# Patient Record
Sex: Male | Born: 1945 | Race: Black or African American | Hispanic: No | Marital: Single | Smoking: Never smoker
Health system: Southern US, Community
[De-identification: ages and names within clinical notes are randomized; demographics above are authoritative.]

## PROBLEM LIST (undated history)

## (undated) DIAGNOSIS — I2510 Atherosclerotic heart disease of native coronary artery without angina pectoris: Secondary | ICD-10-CM

## (undated) DIAGNOSIS — I10 Essential (primary) hypertension: Secondary | ICD-10-CM

## (undated) HISTORY — PX: AXILLARY ARTERY - FEMORAL ARTERY BYPASS GRAFT: SUR177

---

## 2021-09-21 ENCOUNTER — Emergency Department (HOSPITAL_BASED_OUTPATIENT_CLINIC_OR_DEPARTMENT_OTHER)
Admission: EM | Admit: 2021-09-21 | Discharge: 2021-09-21 | Disposition: A | Discharge status: Other Acute Inpatient Hospital | Payer: Medicare Other | Attending: Emergency Medicine | Admitting: Emergency Medicine

## 2021-09-21 ENCOUNTER — Other Ambulatory Visit: Payer: Self-pay

## 2021-09-21 ENCOUNTER — Encounter (HOSPITAL_BASED_OUTPATIENT_CLINIC_OR_DEPARTMENT_OTHER): Payer: Self-pay

## 2021-09-21 ENCOUNTER — Emergency Department (HOSPITAL_BASED_OUTPATIENT_CLINIC_OR_DEPARTMENT_OTHER): Payer: Medicare Other

## 2021-09-21 DIAGNOSIS — I700 Atherosclerosis of aorta: Secondary | ICD-10-CM | POA: Diagnosis not present

## 2021-09-21 DIAGNOSIS — I998 Other disorder of circulatory system: Secondary | ICD-10-CM

## 2021-09-21 DIAGNOSIS — I739 Peripheral vascular disease, unspecified: Secondary | ICD-10-CM | POA: Insufficient documentation

## 2021-09-21 DIAGNOSIS — R200 Anesthesia of skin: Secondary | ICD-10-CM | POA: Diagnosis present

## 2021-09-21 DIAGNOSIS — I743 Embolism and thrombosis of arteries of the lower extremities: Secondary | ICD-10-CM | POA: Insufficient documentation

## 2021-09-21 DIAGNOSIS — I745 Embolism and thrombosis of iliac artery: Secondary | ICD-10-CM | POA: Insufficient documentation

## 2021-09-21 DIAGNOSIS — I70201 Unspecified atherosclerosis of native arteries of extremities, right leg: Secondary | ICD-10-CM | POA: Diagnosis not present

## 2021-09-21 HISTORY — DX: Essential (primary) hypertension: I10

## 2021-09-21 HISTORY — DX: Atherosclerotic heart disease of native coronary artery without angina pectoris: I25.10

## 2021-09-21 LAB — CBC WITH DIFFERENTIAL/PLATELET
Abs Immature Granulocytes: 0.02 10*3/uL (ref 0.00–0.07)
Basophils Absolute: 0.1 10*3/uL (ref 0.0–0.1)
Basophils Relative: 1 %
Eosinophils Absolute: 0.1 10*3/uL (ref 0.0–0.5)
Eosinophils Relative: 2 %
HCT: 39.1 % (ref 39.0–52.0)
Hemoglobin: 13 g/dL (ref 13.0–17.0)
Immature Granulocytes: 0 %
Lymphocytes Relative: 26 %
Lymphs Abs: 1.8 10*3/uL (ref 0.7–4.0)
MCH: 30.4 pg (ref 26.0–34.0)
MCHC: 33.2 g/dL (ref 30.0–36.0)
MCV: 91.6 fL (ref 80.0–100.0)
Monocytes Absolute: 0.8 10*3/uL (ref 0.1–1.0)
Monocytes Relative: 11 %
Neutro Abs: 4.3 10*3/uL (ref 1.7–7.7)
Neutrophils Relative %: 60 %
Platelets: 350 10*3/uL (ref 150–400)
RBC: 4.27 MIL/uL (ref 4.22–5.81)
RDW: 14.6 % (ref 11.5–15.5)
WBC: 7.1 10*3/uL (ref 4.0–10.5)
nRBC: 0 % (ref 0.0–0.2)

## 2021-09-21 LAB — BASIC METABOLIC PANEL
Anion gap: 6 (ref 5–15)
BUN: 20 mg/dL (ref 8–23)
CO2: 27 mmol/L (ref 22–32)
Calcium: 9.3 mg/dL (ref 8.9–10.3)
Chloride: 102 mmol/L (ref 98–111)
Creatinine, Ser: 1.49 mg/dL — ABNORMAL HIGH (ref 0.61–1.24)
GFR, Estimated: 48 mL/min — ABNORMAL LOW (ref >60)
Glucose, Bld: 108 mg/dL — ABNORMAL HIGH (ref 70–99)
Potassium: 3.7 mmol/L (ref 3.5–5.1)
Sodium: 135 mmol/L (ref 135–145)

## 2021-09-21 LAB — APTT: aPTT: 37 seconds — ABNORMAL HIGH (ref 24–36)

## 2021-09-21 LAB — PROTIME-INR
INR: 1.2 (ref 0.8–1.2)
Prothrombin Time: 15.2 seconds (ref 11.4–15.2)

## 2021-09-21 MED ORDER — IOHEXOL 350 MG/ML SOLN
80.0000 mL | Freq: Once | INTRAVENOUS | Status: AC | PRN
  Administered 2021-09-21: 80 mL via INTRAVENOUS

## 2021-09-21 MED ORDER — HEPARIN BOLUS VIA INFUSION
4500.0000 [IU] | Freq: Once | INTRAVENOUS | Status: AC
  Administered 2021-09-21: 4500 [IU] via INTRAVENOUS

## 2021-09-21 MED ORDER — HEPARIN (PORCINE) 25000 UT/250ML-% IV SOLN
1100.0000 [IU]/h | INTRAVENOUS | Status: DC
  Administered 2021-09-21: 1100 [IU]/h via INTRAVENOUS
  Filled 2021-09-21: qty 250

## 2021-09-21 NOTE — ED Notes (Signed)
Leaving with air care at this time. ?

## 2021-09-21 NOTE — ED Triage Notes (Signed)
States right leg has been progressively getting numb over the past few weeks. ?

## 2021-09-21 NOTE — ED Provider Notes (Signed)
?[facility] EMERGENCY DEPARTMENT ?Provider Note ? ? ?CSN: [id number] ?Arrival date & time: 09/21/21  1621 ? ?  ? ?History ? ?Chief Complaint  ?Patient presents with  ? Numbness  ? ? ?Noah Stein is a 76 y.o. male.  Presenting to the ER with concern for leg weakness, numbness.  Reports he has been dealing with intermittent numbness in his right leg for a long time, many months.  States that he has noticed it to be worse though over the last few days and more constant.  Also has noted some weakness in his foot.  Patient is a drummer in a local band and states that he had a hard time using the foot drum due to his right foot weakness last night while he was playing.  Does not have any pain in his leg, does sometimes have pain in his low back. ? ?Completed extensive chart review through care everywhere.  Reviewed last vascular surgery note.  Patient has known history of peripheral arterial disease.  Based on the last office visit, they were not planning intervention at the time because he did not have signs of limb threatening ischemia.  Provider documented toes of right foot slightly cooler than left.  Unable to view ABI report. ? ?HPI ? ?  ? ?Home Medications ?Prior to Admission medications   ?Not on File  ?   ? ?Allergies    ?Patient has no known allergies.   ? ?Review of Systems   ?Review of Systems  ?Constitutional:  Negative for chills and fever.  ?HENT:  Negative for ear pain and sore throat.   ?Eyes:  Negative for pain and visual disturbance.  ?Respiratory:  Negative for cough and shortness of breath.   ?Cardiovascular:  Negative for chest pain and palpitations.  ?Gastrointestinal:  Negative for abdominal pain and vomiting.  ?Genitourinary:  Negative for dysuria and hematuria.  ?Musculoskeletal:  Positive for arthralgias and back pain.  ?Skin:  Negative for color change and rash.  ?Neurological:  Negative for seizures and syncope.  ?All other systems reviewed and are negative. ? ?Physical  Exam ?Updated Vital Signs ?BP 127/73   Pulse 64   Temp 98.4 ?F (36.9 ?C) (Oral)   Resp 18   Ht 6' (1.829 m)   Wt 61.2 kg   SpO2 99%   BMI 18.31 kg/m?  ?Physical Exam ?Vitals and nursing note reviewed.  ?Constitutional:   ?   General: He is not in acute distress. ?   Appearance: He is well-developed.  ?HENT:  ?   Head: Normocephalic and atraumatic.  ?Eyes:  ?   Conjunctiva/sclera: Conjunctivae normal.  ?Cardiovascular:  ?   Rate and Rhythm: Normal rate and regular rhythm.  ?   Heart sounds: No murmur heard. ?Pulmonary:  ?   Effort: Pulmonary effort is normal. No respiratory distress.  ?   Breath sounds: Normal breath sounds.  ?Abdominal:  ?   Palpations: Abdomen is soft.  ?   Tenderness: There is no abdominal tenderness.  ?Musculoskeletal:     ?   General: No swelling.  ?   Cervical back: Neck supple.  ?   Comments: Right lower extremity: Unable to obtain Doppler signal in right foot or right ankle, foot feels slightly cooler than left  ?left lower extremity: No palpable pulse, did appreciate weak Doppler signal in foot  ?Skin: ?   General: Skin is warm and dry.  ?   Capillary Refill: Capillary refill takes less than 2 seconds.  ?Neurological:  ?  Mental Status: He is alert.  ?   Comments: Right lower extremity: Normal hip flexion/extension, there is weakness of both dorsiflexion and plantarflexion; sensation to light touch decreased distal to ankle  ?Psychiatric:     ?   Mood and Affect: Mood normal.  ? ? ?ED Results / Procedures / Treatments   ?Labs ?(all labs ordered are listed, but only abnormal results are displayed) ?Labs Reviewed  ?BASIC METABOLIC PANEL - Abnormal; Notable for the following components:  ?    Result Value  ? Glucose, Bld 108 (*)   ? Creatinine, Ser 1.49 (*)   ? GFR, Estimated 48 (*)   ? All other components within normal limits  ?APTT - Abnormal; Notable for the following components:  ? aPTT 37 (*)   ? All other components within normal limits  ?CBC WITH DIFFERENTIAL/PLATELET   ?PROTIME-INR  ?HEPARIN LEVEL (UNFRACTIONATED)  ? ? ?EKG ?None ? ?Radiology ?CT Angio Aortobifemoral W and/or Wo Contrast ? ?Result Date: 09/21/2021 ?CLINICAL DATA:  Right leg pain, concern for limb ischemia, peripheral arterial disease EXAM: CT ANGIOGRAPHY OF ABDOMINAL AORTA WITH ILIOFEMORAL RUNOFF TECHNIQUE: Multidetector CT imaging of the abdomen, pelvis and lower extremities was performed using the standard protocol during bolus administration of intravenous contrast. Multiplanar CT image reconstructions and MIPs were obtained to evaluate the vascular anatomy. RADIATION DOSE REDUCTION: This exam was performed according to the departmental dose-optimization program which includes automated exposure control, adjustment of the mA and/or kV according to patient size and/or use of iterative reconstruction technique. CONTRAST:  80mL OMNIPAQUE IOHEXOL 350 MG/ML SOLN COMPARISON:  None. FINDINGS: VASCULAR Aorta: Severe mixed calcific atherosclerosis of the abdominal aorta with a large burden of eccentric mural thrombus. 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 Standard branching pattern of the abdominal aorta, with solitary bilateral renal arteries. The inferior mesenteric artery is occluded from the origin. RIGHT Lower Extremity Inflow: Approximately 50% stenosis of the right common iliac artery (series 4, image 94). The right external iliac artery is occluded from near the origin, extending through the right common femoral artery. The right internal iliac artery is severely stenotic, nearly occluded at the origin, however patent distally. Outflow: Long segment occlusion of the right common femoral artery, contiguous with occlusion of the right external iliac artery, with reconstitution of the superficial femoral and profundus femoris at their bifurcation via internal iliac collaterals. There is extensive, multifocal greater than 50% mixed atherosclerotic  stenosis along the length of the right superficial femoral artery and popliteal artery, these vessels very diminutive although opacified. Runoff: The right lower extremity runoff vessels are opacified proximally with scattered calcific stenoses, and the vessels are extinguished above the ankle with no runoff. LEFT Lower Extremity Inflow: High-grade, greater than 70% predominantly calcific stenosis near the origin of the left common femoral artery (series 4, image 93). The left external iliac artery is atherosclerotic although patent. High-grade, near occlusive stenosis of the left internal iliac artery near the origin (series 4, image 101). Outflow: Left common femoral artery is patent. There is long segment occlusion of the left superficial femoral artery from near its origin through Hunter's canal. The profunda remains patent and supplies geniculate collaterals. The left popliteal artery is reconstituted above the knee via geniculate collaterals, and is diminutive although opacified along its length. Runoff: The left anterior tibial and peroneal arteries are extinguished above the ankle with one-vessel runoff to the right ankle via the posterior tibial artery. Veins: No obvious  venous abnormality within the limitations of this arterial phase study. Review of the MIP images confirms the above findings. NON-VASCULAR Lower chest: No acute abnormality.  Coronary artery calcifications. Hepatobiliary: No solid liver abnormality is seen. No gallstones, gallbladder wall thickening, or biliary dilatation. Pancreas: Unremarkable. No pancreatic ductal dilatation or surrounding inflammatory changes. Spleen: Normal in size without significant abnormality. Adrenals/Urinary Tract: Adrenal glands are unremarkable. Kidneys are normal, without renal calculi, solid lesion, or hydronephrosis. Bladder is unremarkable. Stomach/Bowel: Stomach is within normal limits. Appendix is not clearly visualized. No evidence of bowel wall  thickening, distention, or inflammatory changes. Large burden of stool throughout the colon and rectum. Lymphatic: No enlarged abdominal or pelvic lymph nodes. Reproductive: No mass or other significant abnormality. Other: No abdominal wall

## 2021-09-21 NOTE — Progress Notes (Signed)
ANTICOAGULATION CONSULT NOTE - Initial Consult ? ?Pharmacy Consult for Heparin ?Indication:  limb ischemia ? ?No Known Allergies ? ?Patient Measurements: ?Height: 6' (182.9 cm) ?Weight: 61.2 kg (135 lb) ?IBW/kg (Calculated) : 77.6 ?Heparin Dosing Weight: 61.2 kg ? ?Vital Signs: ?Temp: 98.4 ?F (36.9 ?C) (03/26 1632) ?Temp Source: Oral (03/26 1632) ?BP: 170/77 (03/26 1632) ?Pulse Rate: 79 (03/26 1632) ? ?Labs: ?Recent Labs  ?  09/21/21 ?1738  ?HGB 13.0  ?HCT 39.1  ?PLT 350  ?APTT 37*  ?LABPROT 15.2  ?INR 1.2  ?CREATININE 1.49*  ? ? ?Estimated Creatinine Clearance: 35.9 mL/min (A) (by C-G formula based on SCr of 1.49 mg/dL (H)). ? ? ?Medical History: ?Past Medical History:  ?Diagnosis Date  ? Coronary artery disease   ? Hypertension   ? ? ?Medications:  ?(Not in a hospital admission) ? ?Scheduled:  ? heparin  4,500 Units Intravenous Once  ? ?Infusions:  ? heparin    ? ?PRN:  ? ?Assessment: ?45 yom with a history of severe PAD. Patient is presenting with numbness. Heparin per pharmacy consult placed for  limb ischemia . ? ?Patient is not on anticoagulation prior to arrival. ? ?Hgb 13; plt 350 ? ?Goal of Therapy:  ?Heparin level 0.3-0.7 units/ml ?Monitor platelets by anticoagulation protocol: Yes ?  ?Plan:  ?Give 4500 units bolus x 1 ?Start heparin infusion at 1100 units/hr ?Check anti-Xa level in 8 hours and daily while on heparin ?Continue to monitor H&H and platelets ? ?Delmar Landau, PharmD, BCPS ?09/21/2021 6:46 PM ?ED Clinical Pharmacist -  [phone] ? ? ? ?

## 2021-09-21 NOTE — ED Notes (Signed)
Patient left car keys to be given to family member. ?

## 2021-09-23 NOTE — Unmapped External Note (Signed)
 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 The patient was then identified in the pre-anesthesia area, then taken to the operating room, placed in the supine position on the operating table, and induced under general endotracheal anesthesia. The patient was correctly positioned, padded at all pressure points.  The right axilla, abdomen, and bilateral groins were then prepared and draped in a sterile fashion.  A pre-operative timeout was performed.  Preoperative antibiotics were administered at this time.     A right subclavicular incision was made with a #15 blade and dissection was carried down through the skin and subcutaneous tissue.  The pectoralis major muscle was identified and the muscle fibers were bluntly separated to expose the clavipectoral fascia. This was divided with electrocautery and the underlying tissue was dissected to reveal the axillary artery and vein. The axillary artery was dissected circumferentially and controlled with vessel loops. A vertical incision was also made over the right groin and dissection was carried down through the skin and subcutaneous tissue. Bridging veins were sharply divided between ligatures of silk. The inguinal ligament was identified on each side followed by the common femoral artery.  Dissection was carried  distally to the bifurcation.  The common femoral, superficial femoral, and profunda arteries were all circumferentially dissected and isolated with Vesseloops.  A long Geister tunneler was then used to tunnel under the pectoralis minor muscle from the right subclavicular incision down to the right inguinal incision and the 8mm ringed PTFE graft was brought through the tunnel. Correct orientation was maintained and 1:1 movement was ensured.    The patient was then systemically heparinized.  ACTs were maintained greater than 250 throughout the case.  Atraumatic vascular clamps were used to control the axillary artery. a #11 blade was used to create an arteriotomy. This was extended with Pott's scissors. The graft was trimmed and beveled and the anastomosis to the axillary artery was created with 5-0 goretex suture. The axillary artery was backbled and the graft was then deaired. An atraumatic clamp was placed proximally on the graft. Vessel loops were pulled up to control the groin vessels.  A vertical arteriotomy was created with a #11 blade on the CFA and was extended proximally to include the distal EIA and distally with Potts scissors. The arterial lumen was noted to have a mix of chronic soft plaque and acute to subacute appearing thrombus. A penfield elevator was used to perform endarterectomy. The end of the graft was cut to length and spatulated. Anastomosis to the CFA was created with running 6-0 goretex suture. Good hemostasis was ensured at the end of the case.  The right subclavicular and right groin incisions were closed with layers of Vicryl, 4-0 Monocryl, and dermabond.  All incisions were covered with Primapore dressings.  The patient tolerated the procedure well.  The patient was transported to the  PACU in good condition.  Electronically signed by:  Alm Jayson Matters, MD 09/23/2021 6:33 PM     Electronically signed by: Alm Jayson Matters, MD Resident 09/23/21 [phone]  As the attending  surgeon, I was present/scrubbed for the critical and key portions of the procedure including the vessel exposure, tunneling and anastomosis.  A second surgeon, Dr. Jerome was immediately available should a second attending be required. A preoperative discussion was had with the patient and documented in the medical record.    Electronically signed by: Eva Jadine Cara, MD 10/06/21 [phone]

## 2021-09-28 NOTE — Unmapped External Note (Signed)
 Operative Note  Noah Stein   [id number]   Date of Procedure: 09/28/2021  8:45 AM  Pre-op Diagnosis: Post-op Infraclavicular hematoma     Post-op Diagnosis: same  Procedure:  1. Evacuation of hematoma     Surgeon: Surgeon(s) and Role:    * Franky JENEANE Chihuahua, MD - Primary    * Ashlee Almarie Ober, MD - Resident - Assisting  Type of Anesthesia: General  EBL: Minimal, 150 cc of old blood  Drains/Packing:  None  Findings: Focal hematoma of 150 cc in volume with no active bleeding. Some oozing along the vein wall and muscle. Irrigated and hemostatic agents used. Dry after some pressure. Reclosed.   Specimens: No specimens collected   Implants:  Implant Name Type Inv. Item Serial No. Manufacturer Lot No. LRB No. Used Action  AGENT HEMOSTATIC SURGIFLO THROMBIN 8 ML KIT [employer] STERILE - [id number] Other AGENT HEMOSTATIC SURGIFLO THROMBIN 8 ML KIT [employer]RIX STERILE  ETHICON [id number] Right 1 Implanted    Complications:  None       Post-Op Condition: stable               Patient Disposition: PACU - hemodynamically stable.  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 Given size of hematoma and compressive symptoms, will plan for evacuation.             Description of the Procedure:   The patient was taken to the OR and placed in the supine position. Anesthesia was induced. The patient was prepped and draped in the usual sterile fashion. Time out was performed. The incision was opened sharply. The closed muscle sutures were cut and 150 cc of old hematoma was evacuated. This was irrigated with copious amounts of normal saline.  Surgiflow and thrombin spray was used to help with hemostasis. Once dry, hemostatic agents were irrigated. The incision was closed with 3-0 vicryl and 4-0 monocryl. Dressed with dermabond.   All counts were correct at the end of the case. He was  extubated and taken to the recovery room in stable condition.   Electronically signed by: Franky JENEANE Chihuahua, MD Date: 09/28/2021  Time: 9:21 AM   Electronically signed by: Franky JENEANE Chihuahua, MD 09/28/21 [phone]

## 2022-01-18 ENCOUNTER — Encounter (HOSPITAL_BASED_OUTPATIENT_CLINIC_OR_DEPARTMENT_OTHER): Payer: Self-pay | Admitting: Emergency Medicine

## 2022-01-18 ENCOUNTER — Emergency Department (HOSPITAL_BASED_OUTPATIENT_CLINIC_OR_DEPARTMENT_OTHER)
Admission: EM | Admit: 2022-01-18 | Discharge: 2022-01-18 | Disposition: A | Discharge status: Other Acute Inpatient Hospital | Payer: Medicare Other | Attending: Emergency Medicine | Admitting: Emergency Medicine

## 2022-01-18 ENCOUNTER — Other Ambulatory Visit: Payer: Self-pay

## 2022-01-18 DIAGNOSIS — I10 Essential (primary) hypertension: Secondary | ICD-10-CM | POA: Insufficient documentation

## 2022-01-18 DIAGNOSIS — I2510 Atherosclerotic heart disease of native coronary artery without angina pectoris: Secondary | ICD-10-CM | POA: Insufficient documentation

## 2022-01-18 DIAGNOSIS — M79662 Pain in left lower leg: Secondary | ICD-10-CM | POA: Insufficient documentation

## 2022-01-18 DIAGNOSIS — I739 Peripheral vascular disease, unspecified: Secondary | ICD-10-CM

## 2022-01-18 NOTE — ED Triage Notes (Addendum)
Pt reports LLE pain; sts it feels similar to sxs he had with RLE ischemia

## 2022-01-18 NOTE — Discharge Instructions (Addendum)
Please go to [facility] Main ER to be evaluated by Dr. Elonda Husky with vascular surgery.

## 2022-01-18 NOTE — ED Notes (Signed)
Called [facility] line for consult talked to Noah Stein will call provider back with vascular at [phone]

## 2022-01-18 NOTE — ED Notes (Signed)
Attempted IV Rn was unsuccessful

## 2022-01-18 NOTE — ED Provider Notes (Signed)
[facility] EMERGENCY DEPARTMENT Provider Note   CSN: [id number] Arrival date & time: 01/18/22  1156     History  Chief Complaint  Patient presents with   Leg Pain    Noah Stein is a 77 y.o. male.  HPI 76 year old male with history of hypertension, CAD, femoral artery bypass graft on the left leg in March 2023 with Dr. Donnal Moat at [facility] presents to the ER with complaints of intermittent leg numbness, feels as though his left leg has been cold for quite some time.  He is concerned that his left lower extremity is ischemic as this feels similar to what his prior presentation.  He has not followed up with Dr. Donnal Moat.  Review of chart of care everywhere does show a visit in March of this year with Dr. Cherly Hensen for postop evaluation.    Home Medications Prior to Admission medications   Not on File      Allergies    Patient has no known allergies.    Review of Systems   Review of Systems Ten systems reviewed and are negative for acute change, except as noted in the HPI.   Physical Exam Updated Vital Signs BP 106/80 (BP Location: Right Arm)   Pulse 83   Temp 98.1 F (36.7 C) (Oral)   Resp (!) 22   SpO2 98%  Physical Exam Vitals reviewed.  Constitutional:      Appearance: Normal appearance.  HENT:     Head: Normocephalic and atraumatic.  Eyes:     General:        Right eye: No discharge.        Left eye: No discharge.     Extraocular Movements: Extraocular movements intact.     Conjunctiva/sclera: Conjunctivae normal.  Musculoskeletal:        General: No swelling. Normal range of motion.     Comments: Left foot toes are cold, but foot warm.  Able to Doppler posterior tibialis but no DP pulses.  No significant overlying skin changes. 4/5 strenght with plantar flexion and dorsiflexion of the left foot   Neurological:     General: No focal deficit present.     Mental Status: He is alert and oriented to person, place, and time.  Psychiatric:        Mood and Affect:  Mood normal.        Behavior: Behavior normal.     ED Results / Procedures / Treatments   Labs (all labs ordered are listed, but only abnormal results are displayed) Labs Reviewed - No data to display  EKG None  Radiology No results found.  Procedures Procedures    Medications Ordered in ED Medications - No data to display  ED Course/ Medical Decision Making/ A&P                            Medical Decision Making 76 year old male presenting with concerns for left lower extremity ischemia.  On exam, able to doppler tibialis posterior pulse but unable to palpate DP.  Toes are cold.  Unfortunately CT scanner this facility is down at present, unable to evaluate patency of LLE.  Discussed with Dr. Elonda Husky with vascular surgery at [facility], who recommends transfer ER to ER for further imaging and evaluation and possible admission. Pt is amenable to this. Pt will be transported via [facility].  This was a shared visit with my supervising physician Dr. Adela Lank who independently saw and  evaluated the patient & provided guidance in evaluation/management/disposition ,in agreement with care   Final Clinical Impression(s) / ED Diagnoses Final diagnoses:  None    Rx / DC Orders ED Discharge Orders     None         Mare Ferrari, PA-C 01/18/22 1325    Melene Plan, DO 01/18/22 1336

## 2022-01-18 NOTE — ED Notes (Signed)
[facility] arrived at [facility]  . Patient was alert x4. Vital were stable.

## 2022-01-18 NOTE — ED Notes (Signed)
Attempted IV access. Was unable to thread IV. MD aware, and he said we could hold off.

## 2022-01-18 NOTE — ED Notes (Signed)
Patient is able to move left leg. States that is he walks long time on leg it hurts

## 2022-01-18 NOTE — ED Notes (Addendum)
Gave report to Avanell Shackleton @ [phone] RN charge at [facility].Gave report to [facility] ground @ 1307pm spoke to [employer]

## 2022-01-26 NOTE — Progress Notes (Signed)
 ------------------------------------------------------------------------------- Summary: Vascular Surgery Clinic Note -------------------------------------------------------------------------------  Vascular Surgery Clinic Note:  Reason for Visit: Ax -fem f/u PCP: Patient Does Not Have Pcp  Assessment and Plan:  76 y.o. male status post right axillofemoral graft with PTFE by Dr. Delos on 09/23/2021 with subsequent hematoma on 09/28/2021 here for bypass surveillance.    Bypass with patent right-unifem with ABIs on the right of 0.7.  His ABI on the left is 0.22 from 0.31 in May.  Stable claudication in his left leg that has not limited his ability to work. Has chronic bilateral foot numbness likely secondary to his DM2.  No rest pain or tissue loss.   F/u in 6 months with bypass duplex and ABIs.  Discussed initiating cilostazol therapy but patient declined at this point given that claudication is mildly bothersome.   - R ax-fem bypass - f/u in 6 months. Cont ASA, Xarelto , and statin.   Greater 50% of today's clinic visit was spent on counseling.   HPI:  76 y.o. male who comes in today for post-op evaluation, status post right axillofemoral graft with PTFE by Dr. Delos on 09/23/2021 with subsequent hematoma removal by Dr. Tina on 09/28/2021 here for post-op evaluation. Bypass with patent right -unifem with ABIs on the right of 0.7.  His ABI on the left is 0.22 from 0.31 in May.  He now has symptoms of numbness and tingling worse on the left side as compared to the right.  That he reports started since surgery.  He also reports symptoms of claudication to the left thigh that also started after the surgery.  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  This is not lifestyle limiting and he reports that he is able to walk through it and it does not bother him that much.  Denies rest pain, does not have tissue loss. He does not smoke cigarettes, smokes  marijuana occasionaly.    Past Medical History:  Diagnosis Date  . Diabetes mellitus (HCC)   . Hyperlipidemia   . Hypertension    Past Surgical History:  Procedure Laterality Date  . AXILLARY ARTERY - FEMORAL ARTERY BYPASS GRAFT Right 09/23/2021   Procedure: AXILLO FEMORAL BYPASS;  Surgeon: Eva Jadine Delos, MD;  Location: [facility] MAIN OR;  Service: Vascular;  Laterality: Right;  . CAROTID ARTERY ANGIOPLASTY    . EVACUATION HEMATOMA ARM Right 09/28/2021   Procedure: EVACUATION HEMATOMA - RIGHT CHEST WALL;  Surgeon: Franky JENEANE Tina, MD;  Location: [facility] MAIN OR;  Service: Vascular;  Laterality: Right;   Prior to Admission medications   Medication Sig Start Date End Date Taking? Authorizing Provider  acetaminophen (TYLENOL) 325 MG tablet Take 2 tablets (650 mg total) by mouth every 6 (six) hours as needed. 10/02/21  Yes Santana Comes, NP  albuterol  90 mcg/actuation inhaler INHALE 2 PUFFS INTO THE LUNGS EVERY 6 HOURS AS NEEDED FOR WHEEZING 10/24/21  Yes Slater Darice Sole, MD  amLODIPine (NORVASC) 10 MG tablet Take 1 tablet (10 mg total) by mouth daily. 05/05/21  Yes Slater Darice Sole, MD  aspirin 81 MG EC tablet *ANTIPLATELET* Take 1 tablet (81 mg total) by mouth daily. 09/28/13  Yes Historical Provider, MD  losartan-hydrochlorothiazide (HYZAAR) 100-12.5 mg per tablet TAKE 1 TABLET BY MOUTH DAILY 08/11/21  Yes Slater Darice Sole, MD  rivaroxaban  (XARELTO ) 2.5 mg tablet *ANTICOAGULANT* Take 1 tablet (2.5 mg total) by mouth 2 times daily. 10/02/21 10/02/22 Yes Santana Comes, NP  atorvastatin (LIPITOR) 20 MG tablet Take 1 tablet (20 mg total) by mouth nightly. 10/03/21   Slater Darice Sole, MD  potassium chloride  ER (KLOR-CON -M, K-DUR) 20 MEQ extended release tablet TAKE 1 TABLET BY MOUTH DAILY 08/25/21   Slater Darice Sole, MD   .alerg   Review systems:  Complete review of systems negative other than what was mentioned in HPI and PMH.   Physical Exam:  Alert and Oriented BP 172/82 (Site: Left arm)   Pulse 75    Ht 1.829 m (6')   Wt 60.2 kg (132 lb 11.2 oz)   BMI 18.00 kg/m  Neck soft and supple; No carotid bruits Regular, rate, and rhythm; no murmurs, rubs or gallops Clear to auscultation bilaterally Abd soft, Nontender Femoral pulses R 2+, left nonpalpable DP and PT signals on right.  L PT signal R axillary and femoral incisions well healed.  No tissue loss bilateral LE  Imaging:  Images were personally reviewed and interpreted and discussed with patient.  Jonell Juniper, MD  Vascular Surgery Fellow  I saw and evaluated the patient and reviewed the resident's note. I agree with the resident's findings and plan.    Electronically Signed by: Franky JENEANE Chihuahua, MD, Attending Physician 02/03/2022 1:29 PM  Franky Chihuahua, MD Vascular and Endovascular Surgery [phone]    Electronically signed by: Franky JENEANE Chihuahua, MD 02/03/22 1329

## 2022-05-28 ENCOUNTER — Ambulatory Visit (INDEPENDENT_AMBULATORY_CARE_PROVIDER_SITE_OTHER): Payer: Medicare Other | Admitting: Internal Medicine

## 2022-05-28 ENCOUNTER — Encounter: Payer: Self-pay | Admitting: Internal Medicine

## 2022-05-28 VITALS — BP 120/76 | HR 61 | Temp 97.8°F | Resp 18 | Ht 72.0 in | Wt <= 1120 oz

## 2022-05-28 DIAGNOSIS — N1831 Chronic kidney disease, stage 3a: Secondary | ICD-10-CM | POA: Diagnosis not present

## 2022-05-28 DIAGNOSIS — I739 Peripheral vascular disease, unspecified: Secondary | ICD-10-CM | POA: Diagnosis not present

## 2022-05-28 DIAGNOSIS — E785 Hyperlipidemia, unspecified: Secondary | ICD-10-CM | POA: Diagnosis not present

## 2022-05-28 DIAGNOSIS — I10 Essential (primary) hypertension: Secondary | ICD-10-CM

## 2022-05-28 DIAGNOSIS — J3089 Other allergic rhinitis: Secondary | ICD-10-CM

## 2022-05-28 MED ORDER — MONTELUKAST SODIUM 10 MG PO TABS: 10.0000 mg | ORAL_TABLET | Freq: Every day | ORAL | 2 refills | Status: DC

## 2022-05-28 NOTE — Patient Instructions (Signed)
Will do BMP for CKD3 evaluation.

## 2022-05-28 NOTE — Progress Notes (Signed)
   Established Patient Office Visit  Subjective   Patient ID: Noah Stein, male    DOB: 11/18/1945  Age: 76 y.o. MRN: [id number]  Chief Complaint  Patient presents with   Follow-up   Hypertension    76 years old with PAD s/p right leg revascularization early this year, he is able to walk with no pain but left leg hurt when he walk less than a block. He follows with vascular surgery at [facility].   He also has hyperlipidemia and take atorvastatin 20 mg daily.   He has CKD 3a and is here for follow up.   He also has diet control diabetes mellitus and dos not check his sugar at home.   Hypertension This is a chronic problem. The problem is controlled. Pertinent negatives include no chest pain, orthopnea or palpitations. Risk factors for coronary artery disease include male gender and dyslipidemia. Past treatments include ACE inhibitors and calcium channel blockers. Hypertensive end-organ damage includes kidney disease and CAD/MI. Identifiable causes of hypertension include a hypertension causing med.      Review of Systems  Constitutional: Negative.   Cardiovascular: Negative.  Negative for chest pain, palpitations and orthopnea.  Gastrointestinal: Negative.   Neurological: Negative.       Objective:     BP 120/76 (BP Location: Left Arm, Patient Position: Sitting, Cuff Size: Normal)   Pulse 61   Temp 97.8 F (36.6 C)   Resp 18   Ht 6' (1.829 m)   Wt 16 lb (7.258 kg)   SpO2 99%   BMI 2.17 kg/m    Physical Exam Constitutional:      Appearance: Normal appearance.  HENT:     Head: Normocephalic and atraumatic.  Cardiovascular:     Rate and Rhythm: Normal rate and regular rhythm.     Heart sounds: Normal heart sounds.  Pulmonary:     Effort: Pulmonary effort is normal.     Breath sounds: Normal breath sounds.  Abdominal:     General: Bowel sounds are normal.     Palpations: Abdomen is soft.  Neurological:     General: No focal deficit present.     Mental  Status: He is alert and oriented to person, place, and time.      No results found for any visits on 05/28/22.    The ASCVD Risk score (Arnett DK, et al., 2019) failed to calculate for the following reasons:   The valid total cholesterol range is 130 to 320 mg/dL    Assessment & Plan:   Problem List Items Addressed This Visit   None   No follow-ups on file.    Eloisa Northern, MD

## 2022-07-17 LAB — BASIC METABOLIC PANEL
BUN/Creatinine Ratio: 20 (ref 10–24)
BUN: 16 mg/dL (ref 8–27)
CO2: 24 mmol/L (ref 20–29)
Calcium: 9.1 mg/dL (ref 8.6–10.2)
Chloride: 103 mmol/L (ref 96–106)
Creatinine, Ser: 0.82 mg/dL (ref 0.76–1.27)
Glucose: 97 mg/dL (ref 70–99)
Potassium: 5 mmol/L (ref 3.5–5.2)
Sodium: 141 mmol/L (ref 134–144)
eGFR: 90 mL/min/{1.73_m2} (ref >59)

## 2022-07-17 LAB — SPECIMEN STATUS REPORT

## 2022-07-21 NOTE — Progress Notes (Signed)
Patient called.  Unable to reach patient.Will mail letter for normal labs.

## 2022-08-24 ENCOUNTER — Other Ambulatory Visit: Payer: Self-pay | Admitting: Internal Medicine

## 2022-08-27 ENCOUNTER — Ambulatory Visit: Payer: BC Managed Care – PPO | Admitting: Internal Medicine

## 2022-08-27 ENCOUNTER — Encounter: Payer: Self-pay | Admitting: Internal Medicine

## 2022-08-27 VITALS — BP 130/80 | Temp 97.3°F | Resp 18 | Ht 72.0 in | Wt 138.5 lb

## 2022-08-27 DIAGNOSIS — I10 Essential (primary) hypertension: Secondary | ICD-10-CM | POA: Diagnosis not present

## 2022-08-27 DIAGNOSIS — E785 Hyperlipidemia, unspecified: Secondary | ICD-10-CM

## 2022-08-27 DIAGNOSIS — R7303 Prediabetes: Secondary | ICD-10-CM

## 2022-08-27 DIAGNOSIS — I739 Peripheral vascular disease, unspecified: Secondary | ICD-10-CM | POA: Diagnosis not present

## 2022-08-27 DIAGNOSIS — R062 Wheezing: Secondary | ICD-10-CM | POA: Diagnosis not present

## 2022-08-27 MED ORDER — ALBUTEROL SULFATE HFA 108 (90 BASE) MCG/ACT IN AERS: 2.0000 | INHALATION_SPRAY | Freq: Four times a day (QID) | RESPIRATORY_TRACT | 3 refills | Status: AC | PRN

## 2022-08-27 MED ORDER — MONTELUKAST SODIUM 10 MG PO TABS: 10.0000 mg | ORAL_TABLET | Freq: Every day | ORAL | 2 refills | Status: DC

## 2022-08-27 NOTE — Assessment & Plan Note (Signed)
stable °

## 2022-08-27 NOTE — Assessment & Plan Note (Signed)
He will continue to follow with vascular surgeon.

## 2022-08-27 NOTE — Progress Notes (Addendum)
   Office Visit  Subjective   Patient ID: Noah Stein   DOB: [birth year]   Age: 77 y.o.   MRN: [id number]   Chief Complaint Chief Complaint  Patient presents with   Follow-up    3 month follow up Primary Hypertension     History of Present Illness 77 years old with follow up. He says he is wheezing at night, he use to take inhalor but stop taking it. He is not use if he has COPD or not. He denies exertional dyspnea.   He has PAD s/p right leg revascularization in 2023. He does not have pain but left leg hurt when he walk less than a block. He follows with vascular surgery at [facility].    He also has hyperlipidemia and take atorvastatin 20 mg daily.    He has CKD 3a but labs done last month shows GFR > 59.    He also has diet control diabetes mellitus and dos not check his sugar at home.    Hypertension This is a chronic problem. The problem is controlled. Pertinent negatives include no chest pain, orthopnea or palpitations. Risk factors for coronary artery disease include male gender and dyslipidemia. Past treatments include ACE inhibitors and calcium channel blockers. Hypertensive end-organ damage includes kidney disease and CAD/MI. Identifiable causes of hypertension include a hypertension causing med.   Past Medical History Past Medical History:  Diagnosis Date   Coronary artery disease    Hypertension      Allergies No Known Allergies   Review of Systems Review of Systems  Constitutional: Negative.   HENT: Negative.    Respiratory:  Positive for wheezing.   Cardiovascular: Negative.   Gastrointestinal: Negative.   Neurological: Negative.        Objective:    Vitals BP 130/80 (BP Location: Left Arm, Patient Position: Sitting, Cuff Size: Normal)   Temp (!) 97.3 F (36.3 C)   Resp 18   Ht 6' (1.829 m)   Wt 138 lb 8 oz (62.8 kg)   BMI 18.78 kg/m    Physical Examination Physical Exam Constitutional:      Appearance: Normal appearance. He is normal  weight.  HENT:     Head: Normocephalic and atraumatic.  Eyes:     Extraocular Movements: Extraocular movements intact.     Pupils: Pupils are equal, round, and reactive to light.  Cardiovascular:     Rate and Rhythm: Normal rate and regular rhythm.     Heart sounds: Normal heart sounds.  Pulmonary:     Effort: Pulmonary effort is normal.     Breath sounds: Normal breath sounds.  Abdominal:     General: Bowel sounds are normal.     Palpations: Abdomen is soft.  Neurological:     General: No focal deficit present.     Mental Status: He is alert and oriented to person, place, and time.        Assessment & Plan:   Wheezing I will give him albuterol inhalor and will do spirometery.  Dyslipidemia Will do lipid panel today, continue with atorvastatin 20 mg daily.  Primary hypertension stable  PAD (peripheral artery disease) ([place]) He will continue to follow with vascular surgeon.  Borderline diabetes mellitus I will do HBA1c today.    Return in about 2 months (around 10/26/2022).   Garwin Brothers, MD

## 2022-08-27 NOTE — Assessment & Plan Note (Signed)
I will give him albuterol inhalor and will do spirometery.

## 2022-08-27 NOTE — Assessment & Plan Note (Signed)
I will do HBA1c today.

## 2022-08-27 NOTE — Assessment & Plan Note (Signed)
Will do lipid panel today, continue with atorvastatin 20 mg daily.

## 2022-09-01 ENCOUNTER — Ambulatory Visit: Payer: BC Managed Care – PPO

## 2022-10-12 ENCOUNTER — Other Ambulatory Visit: Payer: Self-pay

## 2022-10-12 MED ORDER — XARELTO 2.5 MG PO TABS: 2.5000 mg | ORAL_TABLET | Freq: Two times a day (BID) | ORAL | 1 refills | Status: DC

## 2022-10-29 ENCOUNTER — Ambulatory Visit: Payer: BC Managed Care – PPO | Admitting: Internal Medicine

## 2022-12-10 ENCOUNTER — Other Ambulatory Visit: Payer: Self-pay | Admitting: Internal Medicine

## 2023-02-16 NOTE — Progress Notes (Signed)
 Vascular Surgery Clinic Note:  Reason for Visit: Ax -fem f/u PCP: Patient Does Not Have Pcp  Assessment and Plan:  77 y.o. male with hx of HTN, HLD, CAD, Dm2, and R CEA/L ICA occlusion status post right axillofemoral graft with PTFE on 09/23/2021 with subsequent hematoma washout here for bypass surveillance.    Bypass with patent right-unifem with ABIs on the right of 0.92.  His ABI on the left is 0.49 in May.  Both improved values. Improved claudication left leg that is now minimally noticeable. No rest pain or tissue loss. Will need to continue bypass surveillance. Repeat studies in 1 or so for surveillance.   Can coordinate studies given his ongoing carotid surveillance as well. Can consolidate to 12 months after his next visit for his carotid for his bypass/carotid surveillance.   - R ax-fem bypass - Cont ASA, low dose Xarelto , and statin.  - Carotid disease - cont f/u as sch in 4 months for ongoing surveillance.    Greater 50% of today's clinic visit was spent on counseling.   HPI:  77 y.o. male with hx of HTN, HLD, CAD, Dm2, and R CEA/L ICA occlusion status post right axillofemoral graft with PTFE on 09/23/2021 with subsequent hematoma washout here for bypass surveillance.   Doing well overall with minimal if any claudication symptoms. Remains active and ambulatory at home. No rest pain or wounds. Former smoker. Some marijuana use. No notable numbness in his lower extremities.    Past Medical History:  Diagnosis Date  . Diabetes mellitus (HCC)   . Hyperlipidemia   . Hypertension    Past Surgical History:  Procedure Laterality Date  . AXILLARY ARTERY - FEMORAL ARTERY BYPASS GRAFT Right 09/23/2021   Procedure: AXILLO FEMORAL BYPASS;  Surgeon: Eva Jadine Cara, MD;  Location: [facility] MAIN OR;  Service: Vascular;  Laterality: Right;  . CAROTID ARTERY ANGIOPLASTY    . EVACUATION HEMATOMA ARM Right 09/28/2021   Procedure: EVACUATION HEMATOMA - RIGHT CHEST WALL;  Surgeon: Franky JENEANE Chihuahua,  MD;  Location: [facility] MAIN OR;  Service: Vascular;  Laterality: Right;   Prior to Admission medications   Medication Sig Start Date End Date Taking? Authorizing Provider  acetaminophen (TYLENOL) 325 MG tablet Take 2 tablets (650 mg total) by mouth every 6 (six) hours as needed. 10/02/21  Yes Santana Comes, NP  albuterol  90 mcg/actuation inhaler INHALE 2 PUFFS INTO THE LUNGS EVERY 6 HOURS AS NEEDED FOR WHEEZING 10/24/21  Yes Slater Darice Sole, MD  amLODIPine (NORVASC) 10 MG tablet Take 1 tablet (10 mg total) by mouth daily. 05/05/21  Yes Slater Darice Sole, MD  aspirin 81 MG EC tablet *ANTIPLATELET* Take 1 tablet (81 mg total) by mouth daily. 09/28/13  Yes Historical Provider, MD  losartan-hydrochlorothiazide (HYZAAR) 100-12.5 mg per tablet TAKE 1 TABLET BY MOUTH DAILY 08/11/21  Yes Slater Darice Sole, MD  rivaroxaban  (XARELTO ) 2.5 mg tablet *ANTICOAGULANT* Take 1 tablet (2.5 mg total) by mouth 2 times daily. 10/02/21 10/02/22 Yes Santana Comes, NP  atorvastatin (LIPITOR) 20 MG tablet Take 1 tablet (20 mg total) by mouth nightly. 10/03/21   Slater Darice Sole, MD  potassium chloride  ER (KLOR-CON -M, K-DUR) 20 MEQ extended release tablet TAKE 1 TABLET BY MOUTH DAILY 08/25/21   Slater Darice Sole, MD   .alerg   Review systems:  Complete review of systems negative other than what was mentioned in HPI and PMH.   Physical Exam:  Alert and Oriented BP (!) 208/81 (BP Location: Left arm, Patient Position: Sitting)  Pulse 56   Ht 1.829 m (6')   Wt 62.3 kg (137 lb 6.4 oz)   SpO2 96%   BMI 18.63 kg/m  Neck soft and supple; No carotid bruits Regular, rate, and rhythm; no murmurs, rubs or gallops Clear to auscultation bilaterally Abd soft, Nontender Femoral pulses R 2+, left nonpalpable DP and PT signals bilaterally.  No wounds or edema.   Imaging:  Images were personally reviewed and interpreted and discussed with patient.  Franky Chihuahua, MD Vascular and Endovascular Surgery [phone]

## 2023-02-19 ENCOUNTER — Other Ambulatory Visit: Payer: Self-pay | Admitting: Internal Medicine

## 2023-04-13 ENCOUNTER — Emergency Department (HOSPITAL_BASED_OUTPATIENT_CLINIC_OR_DEPARTMENT_OTHER): Payer: BC Managed Care – PPO

## 2023-04-13 ENCOUNTER — Emergency Department (HOSPITAL_BASED_OUTPATIENT_CLINIC_OR_DEPARTMENT_OTHER)
Admission: EM | Admit: 2023-04-13 | Discharge: 2023-04-13 | Disposition: A | Discharge status: Home / Self Care | Payer: BC Managed Care – PPO | Attending: Emergency Medicine | Admitting: Emergency Medicine

## 2023-04-13 ENCOUNTER — Encounter (HOSPITAL_BASED_OUTPATIENT_CLINIC_OR_DEPARTMENT_OTHER): Payer: Self-pay | Admitting: Emergency Medicine

## 2023-04-13 ENCOUNTER — Other Ambulatory Visit: Payer: Self-pay

## 2023-04-13 DIAGNOSIS — Z1152 Encounter for screening for COVID-19: Secondary | ICD-10-CM | POA: Insufficient documentation

## 2023-04-13 DIAGNOSIS — I2510 Atherosclerotic heart disease of native coronary artery without angina pectoris: Secondary | ICD-10-CM | POA: Diagnosis not present

## 2023-04-13 DIAGNOSIS — Z79899 Other long term (current) drug therapy: Secondary | ICD-10-CM | POA: Insufficient documentation

## 2023-04-13 DIAGNOSIS — F1290 Cannabis use, unspecified, uncomplicated: Secondary | ICD-10-CM | POA: Diagnosis not present

## 2023-04-13 DIAGNOSIS — J40 Bronchitis, not specified as acute or chronic: Secondary | ICD-10-CM | POA: Insufficient documentation

## 2023-04-13 DIAGNOSIS — I10 Essential (primary) hypertension: Secondary | ICD-10-CM | POA: Insufficient documentation

## 2023-04-13 DIAGNOSIS — R0602 Shortness of breath: Secondary | ICD-10-CM | POA: Diagnosis present

## 2023-04-13 LAB — CBC WITH DIFFERENTIAL/PLATELET
Abs Immature Granulocytes: 0 10*3/uL (ref 0.00–0.07)
Basophils Absolute: 0.1 10*3/uL (ref 0.0–0.1)
Basophils Relative: 2 %
Eosinophils Absolute: 0.2 10*3/uL (ref 0.0–0.5)
Eosinophils Relative: 4 %
HCT: 42 % (ref 39.0–52.0)
Hemoglobin: 13.8 g/dL (ref 13.0–17.0)
Immature Granulocytes: 0 %
Lymphocytes Relative: 32 %
Lymphs Abs: 1.5 10*3/uL (ref 0.7–4.0)
MCH: 28.9 pg (ref 26.0–34.0)
MCHC: 32.9 g/dL (ref 30.0–36.0)
MCV: 87.9 fL (ref 80.0–100.0)
Monocytes Absolute: 0.5 10*3/uL (ref 0.1–1.0)
Monocytes Relative: 12 %
Neutro Abs: 2.3 10*3/uL (ref 1.7–7.7)
Neutrophils Relative %: 50 %
Platelets: 266 10*3/uL (ref 150–400)
RBC: 4.78 MIL/uL (ref 4.22–5.81)
RDW: 14.3 % (ref 11.5–15.5)
WBC: 4.6 10*3/uL (ref 4.0–10.5)
nRBC: 0 % (ref 0.0–0.2)

## 2023-04-13 LAB — RESP PANEL BY RT-PCR (RSV, FLU A&B, COVID)  RVPGX2
Influenza A by PCR: NEGATIVE
Influenza B by PCR: NEGATIVE
Resp Syncytial Virus by PCR: NEGATIVE
SARS Coronavirus 2 by RT PCR: NEGATIVE

## 2023-04-13 LAB — BASIC METABOLIC PANEL
Anion gap: 13 (ref 5–15)
BUN: 17 mg/dL (ref 8–23)
CO2: 27 mmol/L (ref 22–32)
Calcium: 9.1 mg/dL (ref 8.9–10.3)
Chloride: 98 mmol/L (ref 98–111)
Creatinine, Ser: 1.52 mg/dL — ABNORMAL HIGH (ref 0.61–1.24)
GFR, Estimated: 46 mL/min — ABNORMAL LOW (ref >60)
Glucose, Bld: 106 mg/dL — ABNORMAL HIGH (ref 70–99)
Potassium: 3.6 mmol/L (ref 3.5–5.1)
Sodium: 138 mmol/L (ref 135–145)

## 2023-04-13 LAB — BRAIN NATRIURETIC PEPTIDE: B Natriuretic Peptide: 93.1 pg/mL (ref 0.0–100.0)

## 2023-04-13 LAB — TROPONIN I (HIGH SENSITIVITY): Troponin I (High Sensitivity): 8 ng/L (ref <18)

## 2023-04-13 MED ORDER — IPRATROPIUM-ALBUTEROL 0.5-2.5 (3) MG/3ML IN SOLN
3.0000 mL | Freq: Once | RESPIRATORY_TRACT | Status: AC
  Administered 2023-04-13: 3 mL via RESPIRATORY_TRACT
  Filled 2023-04-13: qty 3

## 2023-04-13 MED ORDER — PREDNISONE 20 MG PO TABS: 40.0000 mg | ORAL_TABLET | Freq: Every day | ORAL | 0 refills | Status: AC

## 2023-04-13 MED ORDER — ALBUTEROL SULFATE HFA 108 (90 BASE) MCG/ACT IN AERS
2.0000 | INHALATION_SPRAY | Freq: Once | RESPIRATORY_TRACT | Status: AC
  Administered 2023-04-13: 2 via RESPIRATORY_TRACT
  Filled 2023-04-13: qty 6.7

## 2023-04-13 MED ORDER — PREDNISONE 50 MG PO TABS
60.0000 mg | ORAL_TABLET | Freq: Once | ORAL | Status: AC
  Administered 2023-04-13: 60 mg via ORAL
  Filled 2023-04-13: qty 1

## 2023-04-13 MED ORDER — AZITHROMYCIN 250 MG PO TABS: 250.0000 mg | ORAL_TABLET | Freq: Every day | ORAL | 0 refills | Status: AC

## 2023-04-13 MED ORDER — ALBUTEROL SULFATE HFA 108 (90 BASE) MCG/ACT IN AERS: 2.0000 | INHALATION_SPRAY | RESPIRATORY_TRACT | Status: DC | PRN

## 2023-04-13 NOTE — ED Notes (Signed)
Fall risk armband Fall risk sign on door Patient wearing shoes

## 2023-04-13 NOTE — ED Provider Notes (Signed)
[facility] Provider Note   CSN: [id number] Arrival date & time: 04/13/23  0801     History  Chief Complaint  Patient presents with   Shortness of Breath    Noah Stein is a 77 y.o. male.  Patient here with shortness of breath.  Symptoms for the last few months but worse over the last few days.  Has been coughing a lot with some mucus.  Denies any cigarette smoking history.  Does smoke marijuana.  Denies any chest pain weakness numbness tingling.  History of peripheral arterial disease status post bypass graft.  History of CAD and hypertension.  Denies any fevers or chills.  Symptoms are mostly worse with exertion at times.  Denies any leg swelling.  Denies any recent surgery or travel.  The history is provided by the patient.       Home Medications Prior to Admission medications   Medication Sig Start Date End Date Taking? Authorizing Provider  azithromycin (ZITHROMAX) 250 MG tablet Take 1 tablet (250 mg total) by mouth daily. Take first 2 tablets together, then 1 every day until finished. 04/13/23  Yes Noah Fregeau, DO  predniSONE (DELTASONE) 20 MG tablet Take 2 tablets (40 mg total) by mouth daily for 5 days. 04/13/23 04/18/23 Yes Noah Templin, DO  acetaminophen (TYLENOL) 325 MG tablet Take by mouth. 10/02/21   [provider]  albuterol (VENTOLIN HFA) 108 (90 Base) MCG/ACT inhaler Inhale 2 puffs into the lungs every 6 (six) hours as needed for wheezing or shortness of breath. 08/27/22   Noah Northern, MD  amLODipine (NORVASC) 10 MG tablet Take 1 tablet by mouth daily. 09/24/14   [provider]  ASPIRIN LOW DOSE 81 MG tablet Take 81 mg by mouth daily.    [provider]  atorvastatin (LIPITOR) 20 MG tablet Take 20 mg by mouth daily.    [provider]  losartan-hydrochlorothiazide (HYZAAR) 100-12.5 MG tablet Take 1 tablet by mouth daily. 07/14/22   [provider]  montelukast (SINGULAIR)  10 MG tablet TAKE 1 TABLET(10 MG) BY MOUTH AT BEDTIME 02/19/23   Noah Northern, MD  potassium chloride SA (KLOR-CON M) 20 MEQ tablet TAKE 1 TABLET(20 MEQ) BY MOUTH DAILY 02/17/17   [provider]  XARELTO 2.5 MG TABS tablet TAKE 1 TABLET(2.5 MG) BY MOUTH TWICE DAILY 12/10/22   Noah Northern, MD      Allergies    Patient has no known allergies.    Review of Systems   Review of Systems  Physical Exam Updated Vital Signs BP (!) 186/93   Pulse 62   Temp 97.7 F (36.5 C)   Resp 17   Wt 63.5 kg   SpO2 97%   BMI 18.99 kg/m  Physical Exam Vitals and nursing note reviewed.  Constitutional:      General: He is not in acute distress.    Appearance: He is well-developed.  HENT:     Head: Normocephalic and atraumatic.     Mouth/Throat:     Mouth: Mucous membranes are moist.  Eyes:     Extraocular Movements: Extraocular movements intact.     Conjunctiva/sclera: Conjunctivae normal.     Pupils: Pupils are equal, round, and reactive to light.  Cardiovascular:     Rate and Rhythm: Normal rate and regular rhythm.     Pulses: Normal pulses.     Heart sounds: Normal heart sounds. No murmur heard. Pulmonary:     Effort: Pulmonary effort is normal.  No respiratory distress.     Breath sounds: Wheezing present.  Abdominal:     Palpations: Abdomen is soft.     Tenderness: There is no abdominal tenderness.  Musculoskeletal:        General: No swelling.     Cervical back: Normal range of motion and neck supple.     Right lower leg: No edema.     Left lower leg: No edema.  Skin:    General: Skin is warm and dry.     Capillary Refill: Capillary refill takes less than 2 seconds.  Neurological:     Mental Status: He is alert.  Psychiatric:        Mood and Affect: Mood normal.     ED Results / Procedures / Treatments   Labs (all labs ordered are listed, but only abnormal results are displayed) Labs Reviewed  BASIC METABOLIC PANEL - Abnormal; Notable for the following components:       Result Value   Glucose, Bld 106 (*)    Creatinine, Ser 1.52 (*)    GFR, Estimated 46 (*)    All other components within normal limits  RESP PANEL BY RT-PCR (RSV, FLU A&B, COVID)  RVPGX2  CBC WITH DIFFERENTIAL/PLATELET  BRAIN NATRIURETIC PEPTIDE  TROPONIN I (HIGH SENSITIVITY)    EKG EKG Interpretation Date/Time:  Tuesday April 13 2023 08:23:25 EDT Ventricular Rate:  69 PR Interval:  201 QRS Duration:  117 QT Interval:  474 QTC Calculation: 508 R Axis:   76  Text Interpretation: Sinus rhythm Nonspecific intraventricular conduction delay Confirmed by Noah Stein [phone]) on 04/13/2023 9:22:01 AM  Radiology DG Chest Portable 1 View  Result Date: 04/13/2023 CLINICAL DATA:  Shortness of breath. EXAM: PORTABLE CHEST 1 VIEW COMPARISON:  None Available. FINDINGS: The heart size and mediastinal contours are within normal limits. Aortic calcification. Both lungs are clear. No pneumothorax or significant pleural effusion. No acute osseous abnormality. IMPRESSION: No acute cardiopulmonary findings. Electronically Signed   By: Noah Stein M.D.   On: 04/13/2023 09:36    Procedures Procedures    Medications Ordered in ED Medications  albuterol (VENTOLIN HFA) 108 (90 Base) MCG/ACT inhaler 2 puff (has no administration in time range)  predniSONE (DELTASONE) tablet 60 mg (has no administration in time range)  ipratropium-albuterol (DUONEB) 0.5-2.5 (3) MG/3ML nebulizer solution 3 mL (3 mLs Nebulization Given 04/13/23 6045)    ED Course/ Medical Decision Making/ A&P                                 Medical Decision Making Amount and/or Complexity of Data Reviewed Labs: ordered. Radiology: ordered.  Risk Prescription drug management.   Noah Stein is here with shortness of breath.  Seems somewhat chronic but worse the last few days.  Does have a little bit of wheezing on exam.  Vital signs unremarkable.  Differential diagnosis could be reactive airway process, bronchitis but  will evaluate for ACS, pneumonia, electrolyte abnormality, COVID and flu.  Will get CBC BMP troponin, BNP chest x-ray.  EKG shows sinus rhythm.  No ischemic changes.  I reviewed interpreted EKG.  Labs overall unremarkable per my review interpretation.  No significant anemia or electrolyte abnormality.  Creatinine 1.5.  This seems to be around baseline at times.  He is not dehydrated.  COVID and flu test negative.  Chest x-ray negative for pneumonia per my review interpretation.  Troponin and BNP unremarkable.  I have  no concern for ACS or PE or heart failure.  Overall we will treat for what I suspect is bronchitis with steroids and antibiotics and have her follow-up with primary care doctor.  Discharged in good condition.  Understands return precautions.  This chart was dictated using voice recognition software.  Despite best efforts to proofread,  errors can occur which can change the documentation meaning.         Final Clinical Impression(s) / ED Diagnoses Final diagnoses:  Shortness of breath  Bronchitis    Rx / DC Orders ED Discharge Orders          Ordered    predniSONE (DELTASONE) 20 MG tablet  Daily        04/13/23 1109    azithromycin (ZITHROMAX) 250 MG tablet  Daily        04/13/23 1110              Javante Nilsson, Madelaine Bhat, DO 04/13/23 1110

## 2023-04-13 NOTE — ED Triage Notes (Signed)
Reports shortness of breath with exertion x months .  Denies chest pain . Coughing clear mucus for months , he reports.

## 2023-04-13 NOTE — Discharge Instructions (Addendum)
Take next dose of steroid tomorrow.  Take antibiotic when you get home.  Follow-up with your primary care doctor.  Return if symptoms worsen.

## 2023-05-19 ENCOUNTER — Other Ambulatory Visit: Payer: Self-pay | Admitting: Internal Medicine

## 2023-07-14 ENCOUNTER — Other Ambulatory Visit: Payer: Self-pay | Admitting: Internal Medicine

## 2023-08-18 ENCOUNTER — Other Ambulatory Visit: Payer: Self-pay | Admitting: Internal Medicine

## 2023-09-09 ENCOUNTER — Encounter (HOSPITAL_BASED_OUTPATIENT_CLINIC_OR_DEPARTMENT_OTHER): Payer: Self-pay | Admitting: Emergency Medicine

## 2023-09-09 ENCOUNTER — Emergency Department (HOSPITAL_BASED_OUTPATIENT_CLINIC_OR_DEPARTMENT_OTHER)
Admission: EM | Admit: 2023-09-09 | Discharge: 2023-09-09 | Disposition: A | Discharge status: Home / Self Care | Attending: Emergency Medicine | Admitting: Emergency Medicine

## 2023-09-09 ENCOUNTER — Other Ambulatory Visit: Payer: Self-pay

## 2023-09-09 ENCOUNTER — Emergency Department (HOSPITAL_BASED_OUTPATIENT_CLINIC_OR_DEPARTMENT_OTHER)

## 2023-09-09 DIAGNOSIS — I10 Essential (primary) hypertension: Secondary | ICD-10-CM | POA: Insufficient documentation

## 2023-09-09 DIAGNOSIS — E119 Type 2 diabetes mellitus without complications: Secondary | ICD-10-CM | POA: Diagnosis not present

## 2023-09-09 DIAGNOSIS — Z79899 Other long term (current) drug therapy: Secondary | ICD-10-CM | POA: Insufficient documentation

## 2023-09-09 DIAGNOSIS — Z7982 Long term (current) use of aspirin: Secondary | ICD-10-CM | POA: Insufficient documentation

## 2023-09-09 DIAGNOSIS — Z7901 Long term (current) use of anticoagulants: Secondary | ICD-10-CM | POA: Insufficient documentation

## 2023-09-09 DIAGNOSIS — I2510 Atherosclerotic heart disease of native coronary artery without angina pectoris: Secondary | ICD-10-CM | POA: Insufficient documentation

## 2023-09-09 DIAGNOSIS — I70212 Atherosclerosis of native arteries of extremities with intermittent claudication, left leg: Secondary | ICD-10-CM | POA: Insufficient documentation

## 2023-09-09 DIAGNOSIS — R42 Dizziness and giddiness: Secondary | ICD-10-CM | POA: Insufficient documentation

## 2023-09-09 DIAGNOSIS — I739 Peripheral vascular disease, unspecified: Secondary | ICD-10-CM

## 2023-09-09 DIAGNOSIS — M79605 Pain in left leg: Secondary | ICD-10-CM

## 2023-09-09 LAB — CBC WITH DIFFERENTIAL/PLATELET
Abs Immature Granulocytes: 0.02 10*3/uL (ref 0.00–0.07)
Basophils Absolute: 0.1 10*3/uL (ref 0.0–0.1)
Basophils Relative: 1 %
Eosinophils Absolute: 0 10*3/uL (ref 0.0–0.5)
Eosinophils Relative: 0 %
HCT: 40.9 % (ref 39.0–52.0)
Hemoglobin: 13.7 g/dL (ref 13.0–17.0)
Immature Granulocytes: 0 %
Lymphocytes Relative: 14 %
Lymphs Abs: 1.1 10*3/uL (ref 0.7–4.0)
MCH: 29.8 pg (ref 26.0–34.0)
MCHC: 33.5 g/dL (ref 30.0–36.0)
MCV: 89.1 fL (ref 80.0–100.0)
Monocytes Absolute: 0.6 10*3/uL (ref 0.1–1.0)
Monocytes Relative: 7 %
Neutro Abs: 6.1 10*3/uL (ref 1.7–7.7)
Neutrophils Relative %: 78 %
Platelets: 205 10*3/uL (ref 150–400)
RBC: 4.59 MIL/uL (ref 4.22–5.81)
RDW: 14.6 % (ref 11.5–15.5)
WBC: 7.8 10*3/uL (ref 4.0–10.5)
nRBC: 0 % (ref 0.0–0.2)

## 2023-09-09 LAB — BASIC METABOLIC PANEL
Anion gap: 9 (ref 5–15)
BUN: 26 mg/dL — ABNORMAL HIGH (ref 8–23)
CO2: 27 mmol/L (ref 22–32)
Calcium: 9.3 mg/dL (ref 8.9–10.3)
Chloride: 101 mmol/L (ref 98–111)
Creatinine, Ser: 1.65 mg/dL — ABNORMAL HIGH (ref 0.61–1.24)
GFR, Estimated: 42 mL/min — ABNORMAL LOW (ref >60)
Glucose, Bld: 102 mg/dL — ABNORMAL HIGH (ref 70–99)
Potassium: 4.3 mmol/L (ref 3.5–5.1)
Sodium: 137 mmol/L (ref 135–145)

## 2023-09-09 MED ORDER — IOHEXOL 350 MG/ML SOLN
100.0000 mL | Freq: Once | INTRAVENOUS | Status: AC | PRN
  Administered 2023-09-09: 100 mL via INTRAVENOUS

## 2023-09-09 NOTE — ED Provider Notes (Addendum)
 Patient had some left leg pain last night and had some this morning and then had some bilateral leg numbness which led to doing a CT angio.  Results for orders placed or performed during the hospital encounter of 09/09/23  Basic metabolic panel   Collection Time: 09/09/23 10:33 AM  Result Value Ref Range   Sodium 137 135 - 145 mmol/L   Potassium 4.3 3.5 - 5.1 mmol/L   Chloride 101 98 - 111 mmol/L   CO2 27 22 - 32 mmol/L   Glucose, Bld 102 (H) 70 - 99 mg/dL   BUN 26 (H) 8 - 23 mg/dL   Creatinine, Ser 6.57 (H) 0.61 - 1.24 mg/dL   Calcium 9.3 8.9 - 84.6 mg/dL   GFR, Estimated 42 (L) >60 mL/min   Anion gap 9 5 - 15  CBC with Differential   Collection Time: 09/09/23 10:33 AM  Result Value Ref Range   WBC 7.8 4.0 - 10.5 K/uL   RBC 4.59 4.22 - 5.81 MIL/uL   Hemoglobin 13.7 13.0 - 17.0 g/dL   HCT 96.2 95.2 - 84.1 %   MCV 89.1 80.0 - 100.0 fL   MCH 29.8 26.0 - 34.0 pg   MCHC 33.5 30.0 - 36.0 g/dL   RDW 32.4 40.1 - 02.7 %   Platelets 205 150 - 400 K/uL   nRBC 0.0 0.0 - 0.2 %   Neutrophils Relative % 78 %   Neutro Abs 6.1 1.7 - 7.7 K/uL   Lymphocytes Relative 14 %   Lymphs Abs 1.1 0.7 - 4.0 K/uL   Monocytes Relative 7 %   Monocytes Absolute 0.6 0.1 - 1.0 K/uL   Eosinophils Relative 0 %   Eosinophils Absolute 0.0 0.0 - 0.5 K/uL   Basophils Relative 1 %   Basophils Absolute 0.1 0.0 - 0.1 K/uL   Immature Granulocytes 0 %   Abs Immature Granulocytes 0.02 0.00 - 0.07 K/uL   CT ANGIO LOWER EXT BILAT W &/OR WO CONTRAST Result Date: 09/09/2023 CLINICAL DATA:  Claudication or leg ischemia EXAM: CT ANGIOGRAPHY OF ABDOMINAL AORTA WITH ILIOFEMORAL RUNOFF TECHNIQUE: Multidetector CT imaging of the abdomen, pelvis and lower extremities was performed using the standard protocol during bolus administration of intravenous contrast. Multiplanar CT image reconstructions and MIPs were obtained to evaluate the vascular anatomy. RADIATION DOSE REDUCTION: This exam was performed according to the  departmental dose-optimization program which includes automated exposure control, adjustment of the mA and/or kV according to patient size and/or use of iterative reconstruction technique. CONTRAST:  OMNIPAQUE IOHEXOL 350 MG/ML SOLN COMPARISON:  CTA runoff, 09/21/2021. FINDINGS: VASCULAR Aorta: Severe burden of calcified and noncalcified aortic atherosclerosis without aneurysmal dilatation. No dissection, vasculitis or significant stenosis within the abdominal segment. Severe distal abdominal aortic and bifurcation calcified and noncalcified atherosclerosis with bi-iliac occlusion. This is progressed since 09/21/2021 comparison, whereby both common iliac arteries were heavily diseased but patent. Interval RIGHT axillary-to-femoral artery bypass, with the proximal anastomotic site outside the field of view. The graft is widely patent with mildly diseased but patent anastomosis of the RIGHT groin. Celiac: Patent without evidence of aneurysm, dissection, vasculitis or significant stenosis. SMA: Ostial atherosclerosis, otherwise patent without evidence of aneurysm, dissection, vasculitis or significant stenosis. Renals: Single renal arteries are present. Severe burden of ostial calcified atherosclerosis, greater on LEFT with suspected hemodynamically-significant stenosis. Both renal arteries are otherwise well opacified without evidence of aneurysm, dissection, vasculitis, or fibromuscular dysplasia. IMA: Occluded at its origin. RIGHT Lower Extremity Inflow: interval occlusion of the RIGHT  CIA with chronic occlusions of the RIGHT hypogastric, EIA and proximal CFA. Diseased but patent RIGHT axillary-to-femoral artery distal anastomosis at the RIGHT CFA. Outflow: Chronic RIGHT distal profunda femoral artery occlusion. Diseased but patent RIGHT superficial femoral artery, popliteal artery and tibioperoneal trunk. Runoff: Diseased proximal segments, otherwise patent three vessel runoff to the RIGHT ankle. LEFT Lower  Extremity Inflow: Interval occlusion of the LEFT CIA and EIA. Chronic occlusion of the LEFT hypogastric artery. Reconstitution of the proximal LEFT CFA by the inferior epigastric artery and Winslow pathway. Outflow: Severe burden of calcified atherosclerosis of the distal LEFT CFA and femoral artery bifurcation. Chronic LEFT proximal SFA and popliteal artery occlusion. Diseased but otherwise patent LEFT profunda femoral artery, reconstituting the distal popliteal artery and tibioperoneal trunk. Runoff: Mildly diseased proximal segments, otherwise patent three vessel runoff to the LEFT ankle. Veins: No obvious venous abnormality within the limitations of this arterial phase study. Review of the MIP images confirms the above findings. NON-VASCULAR Lower chest: Emphysematous changes of the imaged lung bases. Hepatobiliary: No focal liver abnormality. No gallstones, gallbladder wall thickening, or biliary dilatation. Pancreas: No pancreatic ductal dilatation or surrounding inflammatory changes. Spleen: Normal in size without focal abnormality. Adrenals/Urinary Tract: Adrenal glands are unremarkable. Kidneys are normal, without renal calculi, focal lesion, or hydronephrosis. Bladder is unremarkable. Stomach/Bowel: Stomach is within normal limits. Appendix is not definitively visualized. No evidence of bowel wall thickening, distention, or inflammatory changes. Lymphatic: No enlarged abdominal or pelvic lymph nodes. Reproductive: Prostate is enlarged, measuring up to 5.6 cm in width. Other: No abdominal wall hernia or abnormality. No abdominopelvic ascites. Musculoskeletal: No acute osseous findings. IMPRESSION: Since CTA AP runoff dated 09/21/2021; VASCULAR 1. Severe burden of aortoiliac atherosclerosis without aneurysmal dilatation. Aortic Atherosclerosis (ICD10-I70.0). 2. Acute versus subacute distal aortic bifurcation and bi-iliac occlusion, with LEFT lower extremity collateral reconstitution at the CFA by Winslow  pathway. 3. Interval RIGHT axillary-to-femoral artery bypass appears widely patent, with mildly diseased distal anastomosis. 4. Peripheral arterial disease, with diseased but patent 3-vessel runoff into the ankles. NON-VASCULAR 1. No acute abdominopelvic process. 2. Emphysematous lung bases Emphysema (ICD10-J43.9). Additional incidental, chronic and senescent findings as above. Roanna Banning, MD [facility] Electronically Signed   By: Roanna Banning M.D.   On: 09/09/2023 16:03     Dr. Chestine Spore covering for vascular surgery for [facility] is currently scrubbed in.  He will be able to read the CT scan in about an hour.  In the meantime we will try to contact him vascular surgery in [place] to see if we get some input from them because they follow him as an outpatient.   Discussed with Dr. Chestine Spore on-call vascular surgery at [facility].  He reviewed his CT scans.  Says that everything looks patent that this is the reason why they bypassed him the way they did.  He said outpatient follow-up with his [place] vascular group will be fine.  Patient agrees with the plan.    Vanetta Mulders, MD 09/09/23 1626    Vanetta Mulders, MD 09/09/23 [phone]

## 2023-09-09 NOTE — ED Provider Notes (Signed)
 [facility] EMERGENCY DEPARTMENT AT [facility] [place] Provider Note   CSN: [id number] Arrival date & time: 09/09/23  0865     History  No chief complaint on file.   Noah Stein is a 78 y.o. male.  Patient is a 78 year old male with past medical history of PAD on Xarelto, hypertension, diabetes, CAD presenting to the emergency department with bilateral lower extremity pain.  The patient states he woke up this morning and was ambulating getting ready for the day when he started to develop severe pain in his left ankle and foot.  He states that his left leg then felt numb.  He states he felt lightheaded like he might pass out and had to sit down.  He states that then his right leg started cramping in the thigh and also felt numb.  He denies any saddle anesthesia, loss of bowel or bladder function.  He denies any recent trauma or falls.  He states that his symptoms improved enough that he was able to walk to the car and his family member brought him to the emergency department.  He states that since being at rest in the ER here his pain has improved and his numbness has improved and he now just feels numbness of the bottom of his feet.  The history is provided by the patient.       Home Medications Prior to Admission medications   Medication Sig Start Date End Date Taking? Authorizing Provider  acetaminophen (TYLENOL) 325 MG tablet Take by mouth. 10/02/21   [provider]  albuterol (VENTOLIN HFA) 108 (90 Base) MCG/ACT inhaler Inhale 2 puffs into the lungs every 6 (six) hours as needed for wheezing or shortness of breath. 08/27/22   Eloisa Northern, MD  amLODipine (NORVASC) 10 MG tablet Take 1 tablet by mouth daily. 09/24/14   [provider]  ASPIRIN LOW DOSE 81 MG tablet Take 81 mg by mouth daily.    [provider]  atorvastatin (LIPITOR) 20 MG tablet Take 20 mg by mouth daily.    [provider]  azithromycin (ZITHROMAX) 250 MG tablet Take 1 tablet (250  mg total) by mouth daily. Take first 2 tablets together, then 1 every day until finished. 04/13/23   Curatolo, Adam, DO  losartan-hydrochlorothiazide (HYZAAR) 100-12.5 MG tablet Take 1 tablet by mouth daily. 07/14/22   [provider]  montelukast (SINGULAIR) 10 MG tablet TAKE 1 TABLET(10 MG) BY MOUTH AT BEDTIME 08/18/23   Eloisa Northern, MD  potassium chloride SA (KLOR-CON M) 20 MEQ tablet TAKE 1 TABLET(20 MEQ) BY MOUTH DAILY 02/17/17   [provider]  XARELTO 2.5 MG TABS tablet TAKE 1 TABLET(2.5 MG) BY MOUTH TWICE DAILY 12/10/22   Eloisa Northern, MD      Allergies    Patient has no known allergies.    Review of Systems   Review of Systems  Physical Exam Updated Vital Signs BP (!) 188/62   Pulse 64   Temp 98.3 F (36.8 C)   Resp (!) 22   Ht 6' (1.829 m)   Wt 62.1 kg   SpO2 100%   BMI 18.58 kg/m  Physical Exam Vitals and nursing note reviewed.  Constitutional:      General: He is not in acute distress.    Appearance: Normal appearance.  HENT:     Head: Normocephalic and atraumatic.     Nose: Nose normal.     Mouth/Throat:     Mouth: Mucous membranes are moist.  Pharynx: Oropharynx is clear.  Eyes:     Extraocular Movements: Extraocular movements intact.     Conjunctiva/sclera: Conjunctivae normal.  Cardiovascular:     Rate and Rhythm: Normal rate and regular rhythm.     Heart sounds: Normal heart sounds.     Comments: Doppler signal to L PT and bilateral popliteal, unable to doppler bilateral DP and R PT Pulmonary:     Effort: Pulmonary effort is normal.     Breath sounds: Normal breath sounds.  Abdominal:     General: Abdomen is flat.     Palpations: Abdomen is soft.     Tenderness: There is no abdominal tenderness.  Musculoskeletal:        General: No tenderness. Normal range of motion.     Cervical back: Normal range of motion.     Right lower leg: No edema.     Left lower leg: No edema.  Skin:    General: Skin is warm and dry.  Neurological:      General: No focal deficit present.     Mental Status: He is alert and oriented to person, place, and time.     Sensory: No sensory deficit.     Motor: No weakness (5 out of 5 strength in bilateral plantar/dorsi flexion, knee extension and hip flexion).  Psychiatric:        Mood and Affect: Mood normal.        Behavior: Behavior normal.     ED Results / Procedures / Treatments   Labs (all labs ordered are listed, but only abnormal results are displayed) Labs Reviewed  BASIC METABOLIC PANEL - Abnormal; Notable for the following components:      Result Value   Glucose, Bld 102 (*)    BUN 26 (*)    Creatinine, Ser 1.65 (*)    GFR, Estimated 42 (*)    All other components within normal limits  CBC WITH DIFFERENTIAL/PLATELET    EKG EKG Interpretation Date/Time:  Thursday September 09 2023 11:13:49 EDT Ventricular Rate:  66 PR Interval:  203 QRS Duration:  73 QT Interval:  441 QTC Calculation: 463 R Axis:   74  Text Interpretation: Sinus rhythm No significant change since last tracing Confirmed by Elayne Snare ([id number]) on 09/09/2023 11:20:16 AM  Radiology No results found.  Procedures Procedures    Medications Ordered in ED Medications  iohexol (OMNIPAQUE) 350 MG/ML injection 100 mL (100 mLs Intravenous Contrast Given 09/09/23 1238)    ED Course/ Medical Decision Making/ A&P Clinical Course as of 09/09/23 1510  Thu Sep 09, 2023  1509 Patient signed out to Dr. Deretha Emory in stable condition pending CT read. [VK]    Clinical Course User Index [VK] Rexford Maus, DO                                 Medical Decision Making This patient presents to the ED with chief complaint(s) of leg pain/numbness with pertinent past medical history of PAD on Xarelto, hypertension, diabetes, CAD which further complicates the presenting complaint. The complaint involves an extensive differential diagnosis and also carries with it a high risk of complications and morbidity.    The  differential diagnosis includes leg claudication, arterial occlusion, arrhythmia, anemia, dehydration, electrolyte abnormality, no back pain or trauma and no focal neurologic deficits making cauda equina unlikely  Additional history obtained: Additional history obtained from N/A Records reviewed Care Everywhere/External Records  ED Course  and Reassessment: On patient's arrival he is hemodynamically stable in no acute distress.  Patient does have decreased flow to his legs and I am unable to Doppler bilateral DP pulses or PT on the right which based on his outside records has seemed to progress since his visit in August with vascular surgery.  Will have CT angio of bilateral lower extremities to evaluate for new or worsening occlusion.  Patient will additionally have EKG and labs performed to evaluate for his presyncopal symptoms though I suspect he was likely having vasovagal near syncope related to the pain.  He is currently asymptomatic at this time and will be closely reassessed.  Independent labs interpretation:  The following labs were independently interpreted: at baseline  Independent visualization of imaging: -Pending    Amount and/or Complexity of Data Reviewed Labs: ordered. Radiology: ordered.  Risk Prescription drug management.          Final Clinical Impression(s) / ED Diagnoses Final diagnoses:  None    Rx / DC Orders ED Discharge Orders     None         Rexford Maus, DO 09/09/23 1510

## 2023-09-09 NOTE — ED Triage Notes (Signed)
 Left lower leg pain  leg is already numb , then felt sycopal and then rt  leg started to hurt and then both legs went numb walked into triage w/o diff has a stent he states rt side of body

## 2023-09-09 NOTE — Discharge Instructions (Signed)
 Follow-up with your [facility] [place] vascular surgery group.  Give them a call tomorrow.  Return for any new or worse symptoms.  We reviewed your CT scans here today with our vascular surgeon who says everything is patent and things look good for now.  That outpatient follow-up is appropriate.

## 2023-09-10 NOTE — Telephone Encounter (Signed)
**  TRIAGE CALL**  Reason for Call:pt went to [facility] [address] and they did tests and they wanted him to call his doctor  He doesn't know what they said  but he calling to find out  He said they called our office to talk to someone Pls call to advise  Patient Complaint/Problem:  Onset Date:  Cardiologist/Vascular Surgeon:Chang  Best Call Back [phone]

## 2023-09-10 NOTE — Telephone Encounter (Signed)
 Subject:  Speak with doctor about test performed at [facility].  Background:   Coronary atherosclerosis, DM II.  Assessment:  Patient informs he was seen in [facility] ED yesterday for left leg pain and bilateral lower extremity numbness. At some point he was instructed his vascular provider was contacted and there was nothing further to be done. He does not recall what exactly was said  however he feels better than he did yesterday. He calls to find out if there's anything he should be doing differently.  Recommendations:  EMR reviewed.  Patient instructed in ED to follow up with [facility] after review of results with [facility] on call vascular surgeon Dr Gretta. Referral to scheduling for appt arrangements.

## 2023-09-20 NOTE — Progress Notes (Signed)
 Vascular Surgery Office Note  09/21/2023  Referred by: PCP: No Pcp  Chief Complaint  Patient presents with  . Follow-up    No vas labs.     Subjective: Noah Stein is a 78 y.o. male with PMH significant for former tobacco use, hypertension, DM2, CAD, carotid stenosis status post right CEA and known L ICA occlusion, PAD s/p right ax-uni femoral bypass who presents to the clinic today for hospital follow up.  He went to the emergency department ([facility]) on 09/09/2023 with complaint of severe pain of his left ankle and foot with associated numbness.  Today, he reports the symptoms began as dizziness and then he had a funny feeling all over his body including in his legs.  There was some pain that started in the left leg and shortly after felt in the right leg but he denies any numbness.  Symptoms were short-lived and resolved by the time he was in the emergency department.  He thinks his symptoms were related to marijuana use because he tried something different just before the symptoms occurred.  This was the only episode and he has not had any recurrence.  Has left greater than right lower extremity claudication, stable over the past at least 6 months.  Denies any rest pain or nonhealing wounds.  Upon further workup, CTA was completed in the ED.  The right axillofemoral graft is widely patent.  The right common femoral, SFA, popliteal arteries are patent with three-vessel runoff.  The left CIA, EIA, and hypogastric arteries are occluded with reconstitution of the left CFA.  A long segment of occlusion from the left proximal SFA to popliteal artery with reconstitution of the distal popliteal artery.  There is three-vessel runoff.   He is on [facility] dosing of Xarelto  for PAD along with aspirin and atorvastatin.   Previous vascular interventions: 09/23/2021-right axillofemoral graft with PTFE ([facility])     Assessment/Plan: 1. 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 Iliac artery occlusion, bilateral (CMD)   3. Personal hx of extremity bypass graft   4. Hypertension, benign   5. Hypercholesterolemia      78 y.o. male with the above medical history who had an episode of dizziness and lower extremity pain that resolved quickly. Unclear etiology, but CTA did not reveal any new areas of occlusion to suggest an atheroembolic event or acute on chronic atherosclerosis.  Stable left greater than right lower extremity claudication without rest pain or nonhealing wounds.  Continue aspirin and statin regimen along with a scheduled walking routine.  Will continue scheduled follow-up in February 2026. Stable. Continue aspirin and statin.  Planned surveillance for axillary uni femoral bypass.  Controlled on current regimen. Last LDL 50 (2023).  Continue current regimen.     ROS: Otherwise, as above in HPI   Objective: Vitals:   09/21/23 0919  BP: 129/69  Pulse: 70    Physical Exam: GENERAL: Well appearing, well developed, well nourished, NAD RESPIRATORY: Normal respiratory effort.   CARDIOVASCULAR: Regular rate. No carotid bruit. PULSES: Palpable right axillary femoral graft.  Palpable right femoral.  Nonpalpable left femoral.  Monophasic bilateral peroneal and PT. ABDOMEN: Soft, non-tender, non-distended EXTREMITIES: Feet are warm symmetrically. No obvious deformity, discoloration, ulcers, or edema. NEUROLOGIC: Alert and oriented x 3, no focal deficits   Vascular study results: I have personally reviewed the vascular studies as detailed: CTA as per HPI.    Tobacco Use: Medium Risk (09/21/2023)   Patient History   .  Smoking Tobacco Use: Former   . Smokeless Tobacco Use: Never   . Passive Exposure: Never     Thank you for allowing and trusting us  to participate in this patient's healthcare.  Electronically signed by: Isaiah Annabella Clause, PA-C 09/21/2023 10:03 AM

## 2023-11-04 ENCOUNTER — Emergency Department (HOSPITAL_BASED_OUTPATIENT_CLINIC_OR_DEPARTMENT_OTHER)

## 2023-11-04 ENCOUNTER — Emergency Department (HOSPITAL_BASED_OUTPATIENT_CLINIC_OR_DEPARTMENT_OTHER)
Admission: EM | Admit: 2023-11-04 | Discharge: 2023-11-04 | Disposition: A | Discharge status: Home / Self Care | Attending: Emergency Medicine | Admitting: Emergency Medicine

## 2023-11-04 ENCOUNTER — Encounter (HOSPITAL_BASED_OUTPATIENT_CLINIC_OR_DEPARTMENT_OTHER): Payer: Self-pay | Admitting: Emergency Medicine

## 2023-11-04 ENCOUNTER — Other Ambulatory Visit: Payer: Self-pay

## 2023-11-04 DIAGNOSIS — Z79899 Other long term (current) drug therapy: Secondary | ICD-10-CM | POA: Diagnosis not present

## 2023-11-04 DIAGNOSIS — Z7901 Long term (current) use of anticoagulants: Secondary | ICD-10-CM | POA: Diagnosis not present

## 2023-11-04 DIAGNOSIS — J40 Bronchitis, not specified as acute or chronic: Secondary | ICD-10-CM | POA: Insufficient documentation

## 2023-11-04 DIAGNOSIS — I10 Essential (primary) hypertension: Secondary | ICD-10-CM | POA: Insufficient documentation

## 2023-11-04 DIAGNOSIS — I2510 Atherosclerotic heart disease of native coronary artery without angina pectoris: Secondary | ICD-10-CM | POA: Insufficient documentation

## 2023-11-04 DIAGNOSIS — Z7982 Long term (current) use of aspirin: Secondary | ICD-10-CM | POA: Diagnosis not present

## 2023-11-04 DIAGNOSIS — Z87891 Personal history of nicotine dependence: Secondary | ICD-10-CM | POA: Diagnosis not present

## 2023-11-04 DIAGNOSIS — R0602 Shortness of breath: Secondary | ICD-10-CM | POA: Diagnosis present

## 2023-11-04 LAB — BASIC METABOLIC PANEL WITH GFR
Anion gap: 15 (ref 5–15)
BUN: 20 mg/dL (ref 8–23)
CO2: 28 mmol/L (ref 22–32)
Calcium: 9.7 mg/dL (ref 8.9–10.3)
Chloride: 96 mmol/L — ABNORMAL LOW (ref 98–111)
Creatinine, Ser: 1.15 mg/dL (ref 0.61–1.24)
GFR, Estimated: >60 mL/min (ref >60)
Glucose, Bld: 114 mg/dL — ABNORMAL HIGH (ref 70–99)
Potassium: 3 mmol/L — ABNORMAL LOW (ref 3.5–5.1)
Sodium: 138 mmol/L (ref 135–145)

## 2023-11-04 LAB — CBC
HCT: 43.3 % (ref 39.0–52.0)
Hemoglobin: 14.9 g/dL (ref 13.0–17.0)
MCH: 30.5 pg (ref 26.0–34.0)
MCHC: 34.4 g/dL (ref 30.0–36.0)
MCV: 88.5 fL (ref 80.0–100.0)
Platelets: 288 10*3/uL (ref 150–400)
RBC: 4.89 MIL/uL (ref 4.22–5.81)
RDW: 14.4 % (ref 11.5–15.5)
WBC: 9.8 10*3/uL (ref 4.0–10.5)
nRBC: 0 % (ref 0.0–0.2)

## 2023-11-04 LAB — TROPONIN T, HIGH SENSITIVITY
Troponin T High Sensitivity: 20 ng/L — ABNORMAL HIGH (ref <19)
Troponin T High Sensitivity: 17 ng/L (ref <19)

## 2023-11-04 LAB — PRO BRAIN NATRIURETIC PEPTIDE: Pro Brain Natriuretic Peptide: 750 pg/mL — ABNORMAL HIGH (ref <300.0)

## 2023-11-04 MED ORDER — PREDNISONE 20 MG PO TABS
60.0000 mg | ORAL_TABLET | Freq: Every day | ORAL | 0 refills | Status: AC
Start: 2023-11-04 — End: 2023-11-09

## 2023-11-04 MED ORDER — METHYLPREDNISOLONE SODIUM SUCC 125 MG IJ SOLR
60.0000 mg | Freq: Once | INTRAMUSCULAR | Status: AC
  Administered 2023-11-04: 60 mg via INTRAVENOUS
  Filled 2023-11-04: qty 2

## 2023-11-04 MED ORDER — ALBUTEROL SULFATE HFA 108 (90 BASE) MCG/ACT IN AERS
4.0000 | INHALATION_SPRAY | Freq: Once | RESPIRATORY_TRACT | Status: AC
  Administered 2023-11-04: 4 via RESPIRATORY_TRACT
  Filled 2023-11-04: qty 6.7

## 2023-11-04 MED ORDER — POTASSIUM CHLORIDE CRYS ER 20 MEQ PO TBCR
40.0000 meq | EXTENDED_RELEASE_TABLET | Freq: Once | ORAL | Status: AC
Start: 2023-11-04 — End: 2023-11-04
  Administered 2023-11-04: 40 meq via ORAL
  Filled 2023-11-04: qty 2

## 2023-11-04 MED ORDER — DOXYCYCLINE HYCLATE 100 MG PO CAPS: 100.0000 mg | ORAL_CAPSULE | Freq: Two times a day (BID) | ORAL | 0 refills | Status: AC

## 2023-11-04 MED ORDER — SODIUM CHLORIDE 0.9 % IV SOLN: INTRAVENOUS | Status: DC | PRN

## 2023-11-04 MED ORDER — IOHEXOL 350 MG/ML SOLN
75.0000 mL | Freq: Once | INTRAVENOUS | Status: AC | PRN
  Administered 2023-11-04: 75 mL via INTRAVENOUS

## 2023-11-04 MED ORDER — POTASSIUM CHLORIDE 10 MEQ/100ML IV SOLN
10.0000 meq | Freq: Once | INTRAVENOUS | Status: AC
  Administered 2023-11-04: 10 meq via INTRAVENOUS
  Filled 2023-11-04: qty 100

## 2023-11-04 MED ORDER — IPRATROPIUM-ALBUTEROL 0.5-2.5 (3) MG/3ML IN SOLN
3.0000 mL | Freq: Once | RESPIRATORY_TRACT | Status: AC
  Administered 2023-11-04: 3 mL via RESPIRATORY_TRACT
  Filled 2023-11-04: qty 3

## 2023-11-04 NOTE — ED Provider Notes (Signed)
 [place] [facility] Provider Note   CSN: [id number] Arrival date & time: 11/04/23  8295     History  Chief Complaint  Patient presents with   Shortness of Breath    Noah Stein is a 78 y.o. male.  Patient here with shortness of breath.  Symptoms for the last couple days.  He said a cough with mild sputum production.  Sounds like he is on antibiotic and steroid already for this started 2 days ago.  He cannot really confirm his medication but talk to her primary care doctor about this recently.  Denies any chest pain weakness numbness tingling.  He lives by himself.  He of high cholesterol CAD.  No nausea vomiting diarrhea.  The history is provided by the patient.       Home Medications Prior to Admission medications   Medication Sig Start Date End Date Taking? Authorizing Provider  doxycycline (VIBRAMYCIN) 100 MG capsule Take 1 capsule (100 mg total) by mouth 2 (two) times daily. 11/04/23  Yes Aamir Mclinden, DO  predniSONE  (DELTASONE ) 20 MG tablet Take 3 tablets (60 mg total) by mouth daily for 5 days. 11/04/23 11/09/23 Yes Ishan Sanroman, DO  acetaminophen (TYLENOL) 325 MG tablet Take by mouth. 10/02/21   [provider]  albuterol  (VENTOLIN  HFA) 108 (90 Base) MCG/ACT inhaler Inhale 2 puffs into the lungs every 6 (six) hours as needed for wheezing or shortness of breath. 08/27/22   Amin, Saad, MD  amLODipine (NORVASC) 10 MG tablet Take 1 tablet by mouth daily. 09/24/14   [provider]  ASPIRIN LOW DOSE 81 MG tablet Take 81 mg by mouth daily.    [provider]  atorvastatin (LIPITOR) 20 MG tablet Take 20 mg by mouth daily.    [provider]  azithromycin  (ZITHROMAX ) 250 MG tablet Take 1 tablet (250 mg total) by mouth daily. Take first 2 tablets together, then 1 every day until finished. 04/13/23   Edge Mauger, DO  losartan-hydrochlorothiazide (HYZAAR) 100-12.5 MG tablet Take 1 tablet by mouth daily. 07/14/22    [provider]  montelukast  (SINGULAIR ) 10 MG tablet TAKE 1 TABLET(10 MG) BY MOUTH AT BEDTIME 08/18/23   Amin, Saad, MD  potassium chloride SA (KLOR-CON M) 20 MEQ tablet TAKE 1 TABLET(20 MEQ) BY MOUTH DAILY 02/17/17   [provider]  XARELTO  2.5 MG TABS tablet TAKE 1 TABLET(2.5 MG) BY MOUTH TWICE DAILY 12/10/22   Amin, Saad, MD      Allergies    Patient has no known allergies.    Review of Systems   Review of Systems  Physical Exam Updated Vital Signs BP (!) 166/73   Pulse 69   Temp 97.7 F (36.5 C) (Oral)   Resp (!) 31   Wt 61.2 kg   SpO2 93%   BMI 18.31 kg/m  Physical Exam Vitals and nursing note reviewed.  Constitutional:      General: He is not in acute distress.    Appearance: He is well-developed. He is not ill-appearing.  HENT:     Head: Normocephalic and atraumatic.     Mouth/Throat:     Mouth: Mucous membranes are moist.  Eyes:     Extraocular Movements: Extraocular movements intact.     Conjunctiva/sclera: Conjunctivae normal.     Pupils: Pupils are equal, round, and reactive to light.  Cardiovascular:     Rate and Rhythm: Normal rate and regular rhythm.     Pulses: Normal pulses.  Heart sounds: Normal heart sounds. No murmur heard. Pulmonary:     Effort: Pulmonary effort is normal. No respiratory distress.     Breath sounds: Wheezing present.  Abdominal:     Palpations: Abdomen is soft.     Tenderness: There is no abdominal tenderness.  Musculoskeletal:        General: No swelling. Normal range of motion.     Cervical back: Normal range of motion and neck supple.     Right lower leg: No edema.     Left lower leg: No edema.  Skin:    General: Skin is warm and dry.     Capillary Refill: Capillary refill takes less than 2 seconds.  Neurological:     General: No focal deficit present.     Mental Status: He is alert.  Psychiatric:        Mood and Affect: Mood normal.     ED Results / Procedures / Treatments   Labs (all labs  ordered are listed, but only abnormal results are displayed) Labs Reviewed  BASIC METABOLIC PANEL WITH GFR - Abnormal; Notable for the following components:      Result Value   Potassium 3.0 (*)    Chloride 96 (*)    Glucose, Bld 114 (*)    All other components within normal limits  PRO BRAIN NATRIURETIC PEPTIDE - Abnormal; Notable for the following components:   Pro Brain Natriuretic Peptide 750.0 (*)    All other components within normal limits  TROPONIN T, HIGH SENSITIVITY - Abnormal; Notable for the following components:   Troponin T High Sensitivity 20 (*)    All other components within normal limits  CBC  TROPONIN T, HIGH SENSITIVITY    EKG EKG Interpretation Date/Time:  Thursday Nov 04 2023 09:59:21 EDT Ventricular Rate:  79 PR Interval:  164 QRS Duration:  105 QT Interval:  409 QTC Calculation: 469 R Axis:   79  Text Interpretation: Sinus rhythm Ventricular premature complex Confirmed by Lowery Rue [id number]) on 11/04/2023 10:02:55 AM  Radiology CT Angio Chest PE W and/or Wo Contrast Result Date: 11/04/2023 CLINICAL DATA:  Concern for pulmonary edema. EXAM: CT ANGIOGRAPHY CHEST WITH CONTRAST TECHNIQUE: Multidetector CT imaging of the chest was performed using the standard protocol during bolus administration of intravenous contrast. Multiplanar CT image reconstructions and MIPs were obtained to evaluate the vascular anatomy. RADIATION DOSE REDUCTION: This exam was performed according to the departmental dose-optimization program which includes automated exposure control, adjustment of the mA and/or kV according to patient size and/or use of iterative reconstruction technique. CONTRAST:  75mL OMNIPAQUE  IOHEXOL  350 MG/ML SOLN COMPARISON:  Chest radiograph dated 11/04/2023. FINDINGS: Cardiovascular: There is no cardiomegaly or pericardial effusion. Moderate atherosclerotic calcification of the thoracic aorta. No intimal dilatation or dissection. The origins of the great vessels of  the arch appear patent. No pulmonary artery embolus identified. Mediastinum/Nodes: No hilar or mediastinal adenopathy. The esophagus is grossly unremarkable no mediastinal fluid collection. Lungs/Pleura: Bibasilar subpleural atelectasis/scarring. No focal consolidation, pleural effusion or pneumothorax. There is background of emphysema. The central airways are patent. There is peribronchial thickening may represent bronchitis. Upper Abdomen: No acute abnormality. Musculoskeletal: No acute osseous pathology. Review of the MIP images confirms the above findings. IMPRESSION: 1. No acute intrathoracic pathology. No CT evidence of pulmonary artery embolus. 2. Aortic Atherosclerosis (ICD10-I70.0) and Emphysema (ICD10-J43.9). Electronically Signed   By: Angus Bark M.D.   On: 11/04/2023 13:50   DG Chest 2 View Result Date: 11/04/2023 CLINICAL DATA:  Shortness of breath. EXAM: CHEST - 2 VIEW COMPARISON:  Chest radiograph dated 04/13/2023. FINDINGS: Background of emphysema. No focal consolidation, pleural effusion, or pneumothorax. The cardiac silhouette is within limits. No acute osseous pathology. IMPRESSION: 1. No active cardiopulmonary disease. 2. Emphysema. Electronically Signed   By: Angus Bark M.D.   On: 11/04/2023 11:03    Procedures Procedures    Medications Ordered in ED Medications  0.9 %  sodium chloride infusion (0 mLs Intravenous Stopped 11/04/23 1357)  albuterol  (VENTOLIN  HFA) 108 (90 Base) MCG/ACT inhaler 4 puff (has no administration in time range)  ipratropium-albuterol  (DUONEB) 0.5-2.5 (3) MG/3ML nebulizer solution 3 mL (3 mLs Nebulization Given 11/04/23 1055)  methylPREDNISolone sodium succinate (SOLU-MEDROL) 125 mg/2 mL injection 60 mg (60 mg Intravenous Given 11/04/23 1113)  potassium chloride SA (KLOR-CON M) CR tablet 40 mEq (40 mEq Oral Given 11/04/23 1238)  potassium chloride 10 mEq in 100 mL IVPB (0 mEq Intravenous Stopped 11/04/23 1357)  iohexol  (OMNIPAQUE ) 350 MG/ML injection 75  mL (75 mLs Intravenous Contrast Given 11/04/23 1325)    ED Course/ Medical Decision Making/ A&P                                 Medical Decision Making Amount and/or Complexity of Data Reviewed Labs: ordered. Radiology: ordered.  Risk Prescription drug management.   Noah Stein is here with cough and shortness of breath.  Unremarkable vitals.  No fever.  History of CAD hypertension.  Sounds like he is may be on antibiotic and steroids already for this process he was seen 2 days ago for the same with his primary care doctor.  Scott mild wheezing on exam.  Differential diagnosis likely bronchitis seems less likely to be heart failure ACS.  Have no concern for PE.  Ultimately EKG shows sinus rhythm.  No ischemic changes per my review interpretation.  Will check CBC BMP troponin BNP and chest x-ray and reevaluate.  Will give breathing treatment and IV Solu-Medrol and reevaluate.  He is a former smoker.  Overall troponin 20 and 17.  Potassium was 3 this was repleted.  He had no significant leukocytosis anemia or electrolyte abnormality otherwise.  BNP 700.  Chest x-ray with no evidence of pneumonia or pneumothorax.  CT scan of the chest was obtained that showed no evidence of pneumonia and pneumothorax or PE.  Overall unremarkable study.  He was feeling better after breathing treatments.  He did still have some slightly increased work of breathing but he did want to go home and continue treatment outpatient.  I did offer him admission given wheezing and slightly increased work of breathing but he is feeling comfortable enough to try to continue treatment at home.  Will prescribe prednisone  antibiotics have a follow-up primary care.  Continue albuterol  inhaler as well.  This chart was dictated using voice recognition software.  Despite best efforts to proofread,  errors can occur which can change the documentation meaning.         Final Clinical Impression(s) / ED Diagnoses Final diagnoses:   Bronchitis    Rx / DC Orders ED Discharge Orders          Ordered    predniSONE  (DELTASONE ) 20 MG tablet  Daily        11/04/23 1403    doxycycline (VIBRAMYCIN) 100 MG capsule  2 times daily        11/04/23 1403  Lowery Rue, DO 11/04/23 1404

## 2023-11-04 NOTE — ED Triage Notes (Signed)
 Shortness of breath x 5 days . Reports his inhaler is not helping . Productive Cough  x a few days he said .

## 2023-11-04 NOTE — Discharge Instructions (Addendum)
 Discontinue medication started by your prior doctor and start steroids that I have prescribed and doxycycline antibiotic as I prescribed.  Continue your albuterol  inhaler 2 to 4 puffs every 4 hours as needed.  Return if symptoms worsen.

## 2023-11-04 NOTE — ED Notes (Signed)
   11/04/23 1139  Resting  Supplemental oxygen during test? No  Resting Heart Rate 77  Resting Sp02 97  Lap 1 (250 feet)  HR 96  02 Sat 91   Slow gait, without assistance, SpO2 reading from right earlobe lowest reading with good pleth 91%, RR 30's.  Resumed monitoring once back in rm9.

## 2023-11-18 ENCOUNTER — Other Ambulatory Visit: Payer: Self-pay | Admitting: Internal Medicine

## 2023-12-01 IMAGING — CT CT ANGIO AOBIFEM WO/W CM
2 of 12 series · 12 of 46 positions shown, 15 images · non-contrast
Comparison: None.

CLINICAL DATA: Right leg pain, concern for limb ischemia,
peripheral arterial disease

EXAM:
CT ANGIOGRAPHY OF ABDOMINAL AORTA WITH ILIOFEMORAL RUNOFF
TECHNIQUE: Multidetector CT imaging of the abdomen, pelvis and lower
extremities was performed using the standard protocol during bolus
administration of intravenous contrast. Multiplanar CT image
reconstructions and MIPs were obtained to evaluate the vascular
anatomy.

[Series 4: runoff axial arterial · axial · arterial · 0.76mm/px · z∈[+181,+1411]mm · 11 of 470 slices shown, 13 images]
[im 40/470  soft-tissue]
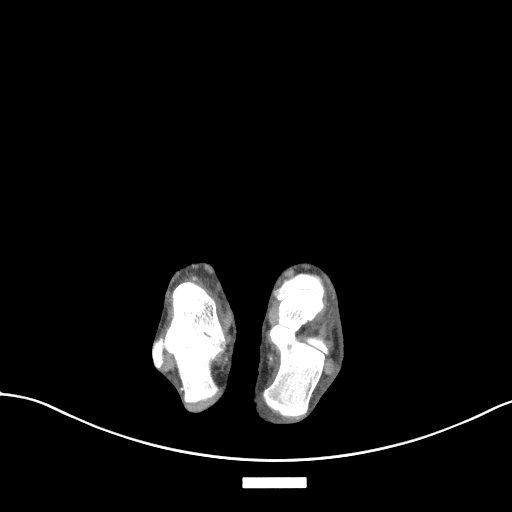
[im 40/470  bone]
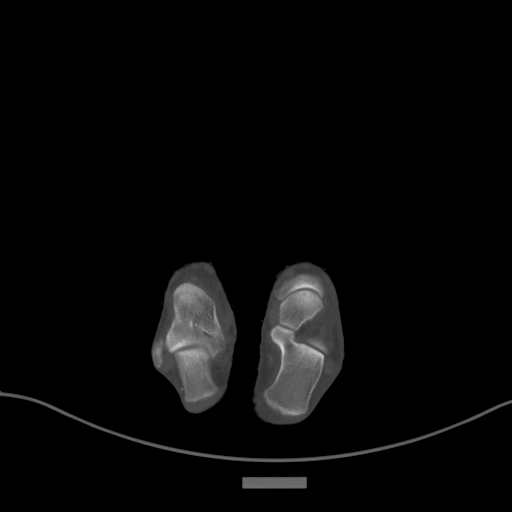
[im 98/470  soft-tissue]
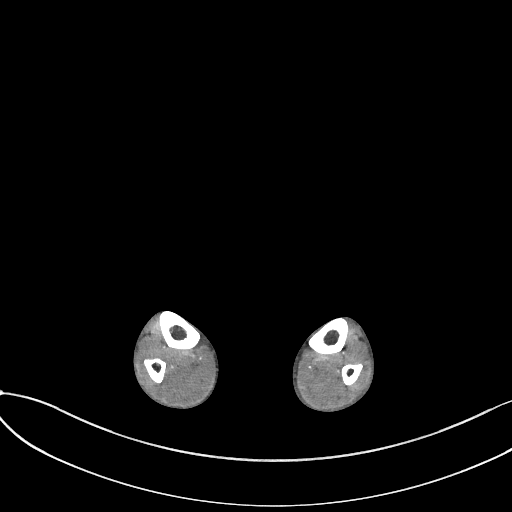
[im 157/470  soft-tissue]
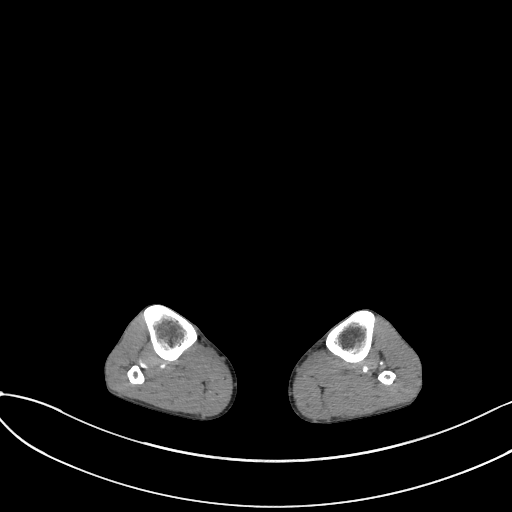
[im 215/470  soft-tissue]
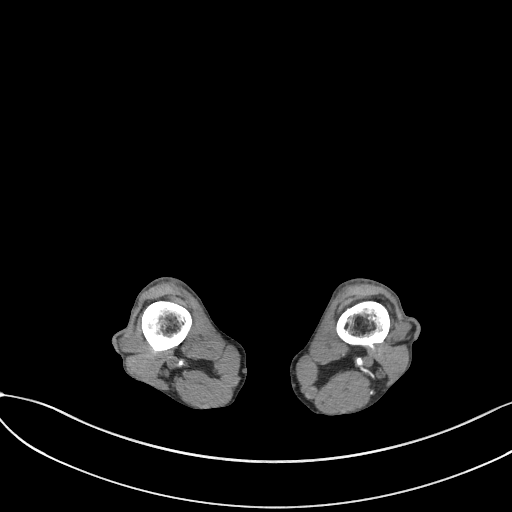
[im 255/470  soft-tissue]
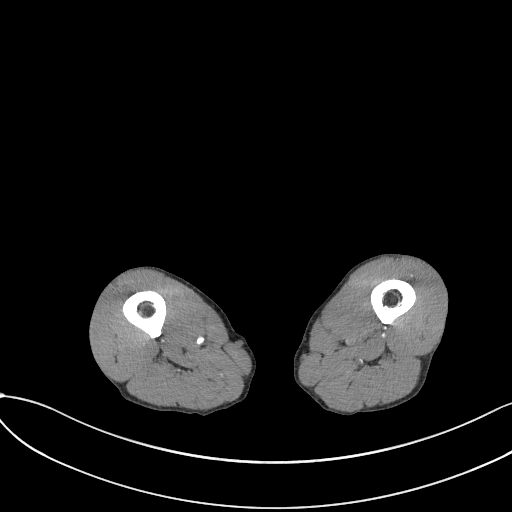
[im 313/470  soft-tissue]
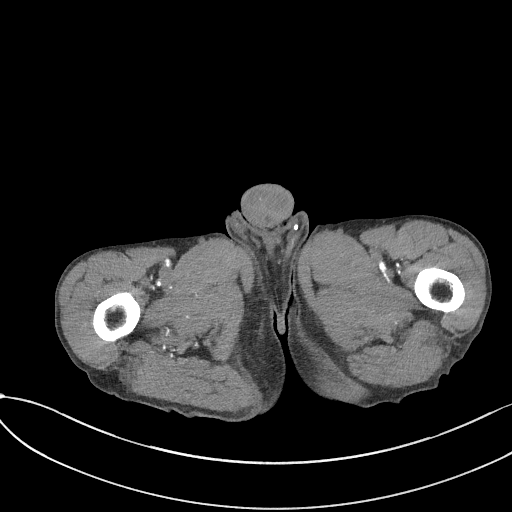
[im 372/470  soft-tissue]
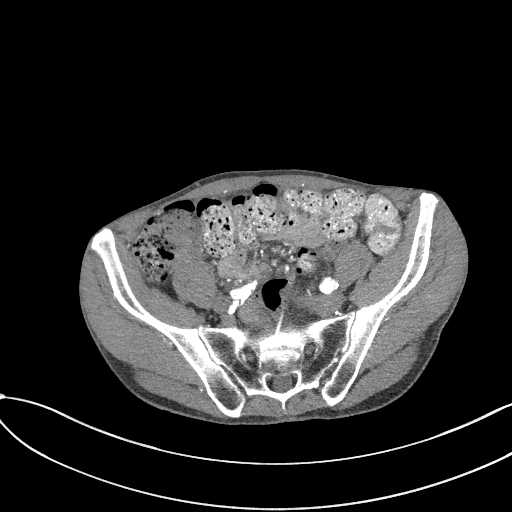
[im 391/470  lung]
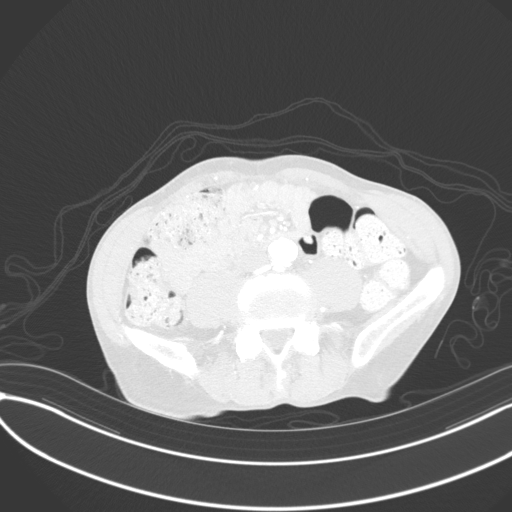
[im 411/470  lung]
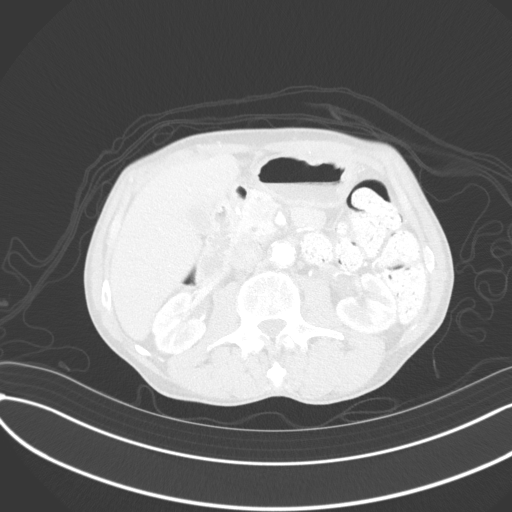
[im 430/470  soft-tissue]
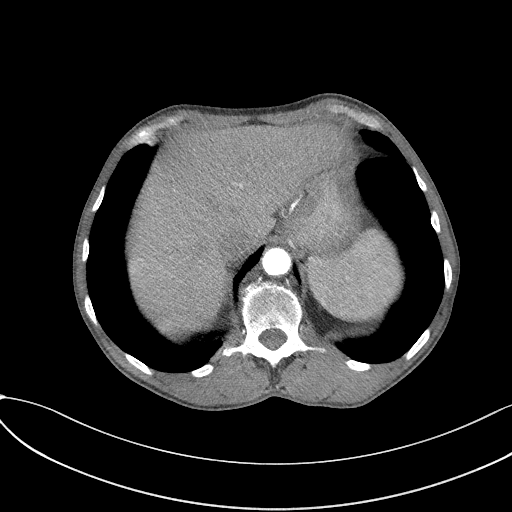
[im 430/470  lung]
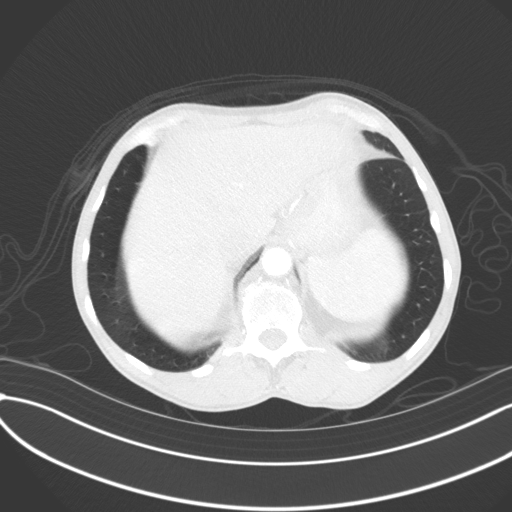
[im 450/470  lung]
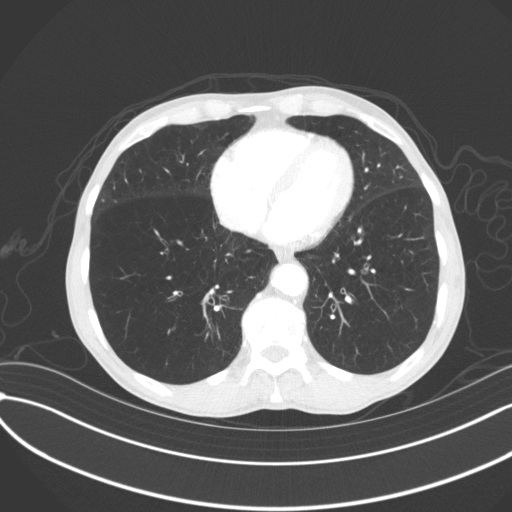

[Series 11: coronal upper · coronal · 0.43mm/px · 1 of 131 slices shown, 2 images]
[im 66/131  soft-tissue]
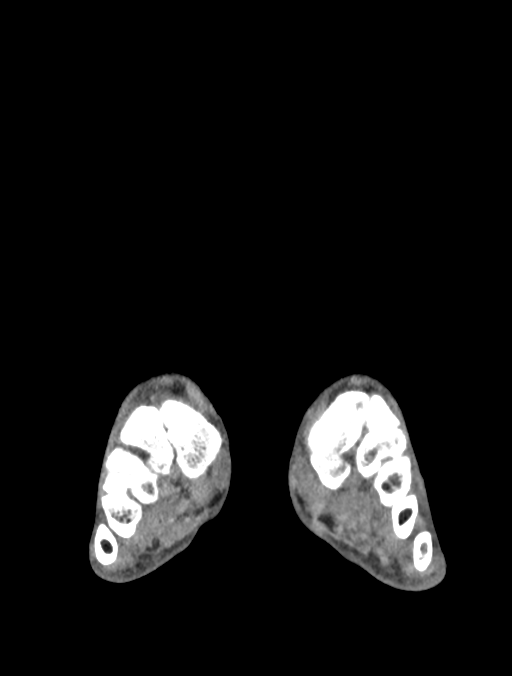
[im 66/131  bone]
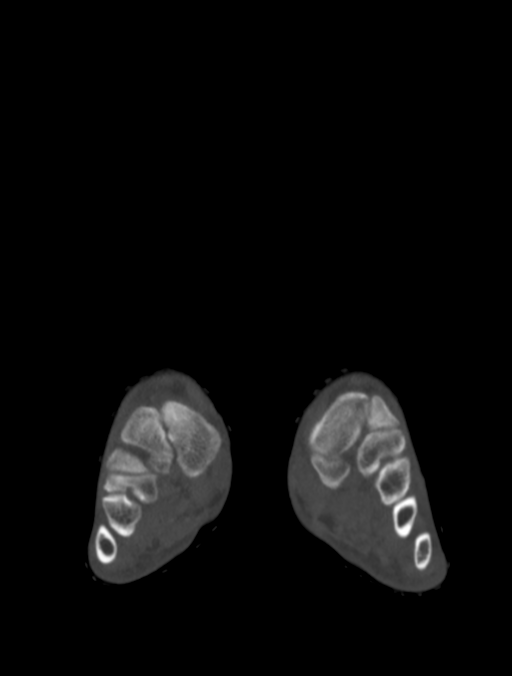

[12 of 46 positions shown; findings below may reference images not displayed]

RADIATION DOSE REDUCTION: This exam was performed according to the
departmental dose-optimization program which includes automated
exposure control, adjustment of the mA and/or kV according to
patient size and/or use of iterative reconstruction technique.

CONTRAST:  80mL OMNIPAQUE IOHEXOL 350 MG/ML SOLN
FINDINGS: VASCULAR

Aorta: Severe mixed calcific atherosclerosis of the abdominal aorta
with a large burden of eccentric mural thrombus. Chronic, calcified
dissection of the anterior infrarenal abdominal aorta, measuring up
to 2.7 x 2.4 cm. Standard branching pattern of the abdominal aorta,
with solitary bilateral renal arteries. The inferior mesenteric
artery is occluded from the origin.

RIGHT Lower Extremity

Inflow: Approximately 50% stenosis of the right common iliac artery
(series 4, image 94). The right external iliac artery is occluded
from near the origin, extending through the right common femoral
artery. The right internal iliac artery is severely stenotic, nearly
occluded at the origin, however patent distally.

Outflow: Long segment occlusion of the right common femoral artery,
contiguous with occlusion of the right external iliac artery, with
reconstitution of the superficial femoral and profundus femoris at
their bifurcation via internal iliac collaterals. There is
extensive, multifocal greater than 50% mixed atherosclerotic
stenosis along the length of the right superficial femoral artery
and popliteal artery, these vessels very diminutive although
opacified.

Runoff: The right lower extremity runoff vessels are opacified
proximally with scattered calcific stenoses, and the vessels are
extinguished above the ankle with no runoff.

LEFT Lower Extremity

Inflow: High-grade, greater than 70% predominantly calcific stenosis
near the origin of the left common femoral artery (series 4, image
93). The left external iliac artery is atherosclerotic although
patent. High-grade, near occlusive stenosis of the left internal
iliac artery near the origin (series 4, image 101).

Outflow: Left common femoral artery is patent. There is long segment
occlusion of the left superficial femoral artery from near its
origin through Hunter's canal. The profunda remains patent and
supplies geniculate collaterals. The left popliteal artery is
reconstituted above the knee via geniculate collaterals, and is
diminutive although opacified along its length.

Runoff: The left anterior tibial and peroneal arteries are
extinguished above the ankle with one-vessel runoff to the right
ankle via the posterior tibial artery.

Veins: No obvious venous abnormality within the limitations of this
arterial phase study.

Review of the MIP images confirms the above findings.

NON-VASCULAR

Lower chest: No acute abnormality.  Coronary artery calcifications.

Hepatobiliary: No solid liver abnormality is seen. No gallstones,
gallbladder wall thickening, or biliary dilatation.

Pancreas: Unremarkable. No pancreatic ductal dilatation or
surrounding inflammatory changes.

Spleen: Normal in size without significant abnormality.

Adrenals/Urinary Tract: Adrenal glands are unremarkable. Kidneys are
normal, without renal calculi, solid lesion, or hydronephrosis.
Bladder is unremarkable.

Stomach/Bowel: Stomach is within normal limits. Appendix is not
clearly visualized. No evidence of bowel wall thickening,
distention, or inflammatory changes. Large burden of stool
throughout the colon and rectum.

Lymphatic: No enlarged abdominal or pelvic lymph nodes.

Reproductive: No mass or other significant abnormality.

Other: No abdominal wall hernia or abnormality. No ascites.

Musculoskeletal: No acute or significant osseous findings.
IMPRESSION: 1. Severe right lower extremity peripheral arterial disease, most
notable for long segment occlusion of the right external iliac
artery and common femoral artery, as well as extensive, multifocal
stenosis along the length of the superficial femoral and popliteal
arteries. No runoff to the right ankle, the runoff vessels
extinguished above the ankle.
2. Severe left lower extremity peripheral arterial disease, most
notable for high-grade, greater than 70% predominantly calcific
stenosis near the origin of the left common femoral artery and long
segment occlusion of the left superficial femoral artery. One-vessel
runoff to the left ankle via the posterior tibial artery.

3. Coronary artery disease.

Aortic Atherosclerosis ([id number]).

## 2024-02-29 ENCOUNTER — Encounter (HOSPITAL_BASED_OUTPATIENT_CLINIC_OR_DEPARTMENT_OTHER): Payer: Self-pay

## 2024-02-29 ENCOUNTER — Other Ambulatory Visit: Payer: Self-pay

## 2024-02-29 ENCOUNTER — Emergency Department (HOSPITAL_BASED_OUTPATIENT_CLINIC_OR_DEPARTMENT_OTHER)

## 2024-02-29 ENCOUNTER — Emergency Department (HOSPITAL_BASED_OUTPATIENT_CLINIC_OR_DEPARTMENT_OTHER)
Admission: EM | Admit: 2024-02-29 | Discharge: 2024-02-29 | Disposition: A | Discharge status: Other Acute Inpatient Hospital

## 2024-02-29 DIAGNOSIS — E119 Type 2 diabetes mellitus without complications: Secondary | ICD-10-CM | POA: Insufficient documentation

## 2024-02-29 DIAGNOSIS — Z951 Presence of aortocoronary bypass graft: Secondary | ICD-10-CM | POA: Insufficient documentation

## 2024-02-29 DIAGNOSIS — Z79899 Other long term (current) drug therapy: Secondary | ICD-10-CM | POA: Insufficient documentation

## 2024-02-29 DIAGNOSIS — R200 Anesthesia of skin: Secondary | ICD-10-CM | POA: Diagnosis present

## 2024-02-29 DIAGNOSIS — R944 Abnormal results of kidney function studies: Secondary | ICD-10-CM | POA: Insufficient documentation

## 2024-02-29 DIAGNOSIS — I2510 Atherosclerotic heart disease of native coronary artery without angina pectoris: Secondary | ICD-10-CM | POA: Insufficient documentation

## 2024-02-29 DIAGNOSIS — Z7982 Long term (current) use of aspirin: Secondary | ICD-10-CM | POA: Insufficient documentation

## 2024-02-29 DIAGNOSIS — I10 Essential (primary) hypertension: Secondary | ICD-10-CM | POA: Insufficient documentation

## 2024-02-29 DIAGNOSIS — I739 Peripheral vascular disease, unspecified: Secondary | ICD-10-CM | POA: Insufficient documentation

## 2024-02-29 DIAGNOSIS — Z743 Need for continuous supervision: Secondary | ICD-10-CM | POA: Diagnosis not present

## 2024-02-29 DIAGNOSIS — Z7901 Long term (current) use of anticoagulants: Secondary | ICD-10-CM | POA: Diagnosis not present

## 2024-02-29 DIAGNOSIS — D72829 Elevated white blood cell count, unspecified: Secondary | ICD-10-CM | POA: Insufficient documentation

## 2024-02-29 DIAGNOSIS — K921 Melena: Secondary | ICD-10-CM | POA: Diagnosis not present

## 2024-02-29 LAB — CBC WITH DIFFERENTIAL/PLATELET
Abs Immature Granulocytes: 0.07 K/uL (ref 0.00–0.07)
Abs Immature Granulocytes: 0.06 K/uL (ref 0.00–0.07)
Basophils Absolute: 0.1 K/uL (ref 0.0–0.1)
Basophils Absolute: 0 K/uL (ref 0.0–0.1)
Basophils Relative: 0 %
Basophils Relative: 0 %
Eosinophils Absolute: 0 K/uL (ref 0.0–0.5)
Eosinophils Absolute: 0 K/uL (ref 0.0–0.5)
Eosinophils Relative: 0 %
Eosinophils Relative: 0 %
HCT: 30.7 % — ABNORMAL LOW (ref 39.0–52.0)
HCT: 29.6 % — ABNORMAL LOW (ref 39.0–52.0)
Hemoglobin: 10.4 g/dL — ABNORMAL LOW (ref 13.0–17.0)
Hemoglobin: 10.1 g/dL — ABNORMAL LOW (ref 13.0–17.0)
Immature Granulocytes: 1 %
Immature Granulocytes: 1 %
Lymphocytes Relative: 14 %
Lymphocytes Relative: 8 %
Lymphs Abs: 2 K/uL (ref 0.7–4.0)
Lymphs Abs: 1 K/uL (ref 0.7–4.0)
MCH: 30.8 pg (ref 26.0–34.0)
MCH: 30.4 pg (ref 26.0–34.0)
MCHC: 33.9 g/dL (ref 30.0–36.0)
MCHC: 34.1 g/dL (ref 30.0–36.0)
MCV: 90.8 fL (ref 80.0–100.0)
MCV: 89.2 fL (ref 80.0–100.0)
Monocytes Absolute: 0.7 K/uL (ref 0.1–1.0)
Monocytes Absolute: 0.4 K/uL (ref 0.1–1.0)
Monocytes Relative: 5 %
Monocytes Relative: 3 %
Neutro Abs: 11.1 K/uL — ABNORMAL HIGH (ref 1.7–7.7)
Neutro Abs: 11.3 K/uL — ABNORMAL HIGH (ref 1.7–7.7)
Neutrophils Relative %: 80 %
Neutrophils Relative %: 88 %
Platelets: 225 K/uL (ref 150–400)
Platelets: 190 K/uL (ref 150–400)
RBC: 3.38 MIL/uL — ABNORMAL LOW (ref 4.22–5.81)
RBC: 3.32 MIL/uL — ABNORMAL LOW (ref 4.22–5.81)
RDW: 14.3 % (ref 11.5–15.5)
RDW: 14 % (ref 11.5–15.5)
WBC: 13.9 K/uL — ABNORMAL HIGH (ref 4.0–10.5)
WBC: 12.7 K/uL — ABNORMAL HIGH (ref 4.0–10.5)
nRBC: 0 % (ref 0.0–0.2)
nRBC: 0 % (ref 0.0–0.2)

## 2024-02-29 LAB — COMPREHENSIVE METABOLIC PANEL WITH GFR
ALT: 34 U/L (ref 0–44)
AST: 29 U/L (ref 15–41)
Albumin: 4 g/dL (ref 3.5–5.0)
Alkaline Phosphatase: 39 U/L (ref 38–126)
Anion gap: 14 (ref 5–15)
BUN: 57 mg/dL — ABNORMAL HIGH (ref 8–23)
CO2: 22 mmol/L (ref 22–32)
Calcium: 9.3 mg/dL (ref 8.9–10.3)
Chloride: 101 mmol/L (ref 98–111)
Creatinine, Ser: 1.39 mg/dL — ABNORMAL HIGH (ref 0.61–1.24)
GFR, Estimated: 51 mL/min — ABNORMAL LOW (ref >60)
Glucose, Bld: 194 mg/dL — ABNORMAL HIGH (ref 70–99)
Potassium: 3.6 mmol/L (ref 3.5–5.1)
Sodium: 137 mmol/L (ref 135–145)
Total Bilirubin: 0.4 mg/dL (ref 0.0–1.2)
Total Protein: 6.1 g/dL — ABNORMAL LOW (ref 6.5–8.1)

## 2024-02-29 LAB — OCCULT BLOOD X 1 CARD TO LAB, STOOL: Fecal Occult Bld: POSITIVE — AB

## 2024-02-29 MED ORDER — PANTOPRAZOLE SODIUM 40 MG IV SOLR
40.0000 mg | Freq: Once | INTRAVENOUS | Status: AC
  Administered 2024-02-29: 40 mg via INTRAVENOUS
  Filled 2024-02-29: qty 10

## 2024-02-29 MED ORDER — IOHEXOL 350 MG/ML SOLN
100.0000 mL | Freq: Once | INTRAVENOUS | Status: AC | PRN
  Administered 2024-02-29: 125 mL via INTRAVENOUS

## 2024-02-29 NOTE — ED Notes (Signed)
 IP RN direct number is [phone] Luan, RN). CRN on unit is [phone]. Mahesa is accepting MD.

## 2024-02-29 NOTE — ED Triage Notes (Signed)
 C/o back pain, bilateral leg numbness x months, worsening. Diarrhea the past few days. Denies abdominal pain.

## 2024-02-29 NOTE — ED Provider Notes (Signed)
 [facility] Provider Note   CSN: [id number] Arrival date & time: 02/29/24  9142     Patient presents with: Back Pain   Noah Stein is a 78 y.o. male history of CAD, PAD on Xarelto , hypertension, diabetes status post femoral artery bypass graft presents with complaints of bilateral lower extremity numbness, pain that radiates up to his hips.  Denies any back pain.  No injury or trauma.  Reports intermittent symptoms for the past month but they have significantly worsened within the past week and day.  Denies any urinary or fecal incontinence.  Patient does report that over the past few days he has been having dark stools and has been having to use the restroom frequently.  Denies any abdominal pain.    Past Medical History:  Diagnosis Date   Coronary artery disease    Hypertension    Past Surgical History:  Procedure Laterality Date   AXILLARY ARTERY - FEMORAL ARTERY BYPASS GRAFT Left        Back Pain  Past Medical History:  Diagnosis Date   Coronary artery disease    Hypertension    Past Surgical History:  Procedure Laterality Date   AXILLARY ARTERY - FEMORAL ARTERY BYPASS GRAFT Left        Prior to Admission medications   Medication Sig Start Date End Date Taking? Authorizing Provider  acetaminophen (TYLENOL) 325 MG tablet Take by mouth. 10/02/21   [provider]  albuterol  (VENTOLIN  HFA) 108 (90 Base) MCG/ACT inhaler Inhale 2 puffs into the lungs every 6 (six) hours as needed for wheezing or shortness of breath. 08/27/22   Amin, Saad, MD  amLODipine (NORVASC) 10 MG tablet Take 1 tablet by mouth daily. 09/24/14   [provider]  ASPIRIN LOW DOSE 81 MG tablet Take 81 mg by mouth daily.    [provider]  atorvastatin (LIPITOR) 20 MG tablet Take 20 mg by mouth daily.    [provider]  azithromycin  (ZITHROMAX ) 250 MG tablet Take 1 tablet (250 mg total) by mouth daily. Take first 2 tablets  together, then 1 every day until finished. 04/13/23   Curatolo, Adam, DO  doxycycline  (VIBRAMYCIN ) 100 MG capsule Take 1 capsule (100 mg total) by mouth 2 (two) times daily. 11/04/23   Curatolo, Adam, DO  losartan-hydrochlorothiazide (HYZAAR) 100-12.5 MG tablet Take 1 tablet by mouth daily. 07/14/22   [provider]  montelukast  (SINGULAIR ) 10 MG tablet TAKE 1 TABLET(10 MG) BY MOUTH AT BEDTIME 08/18/23   Amin, Saad, MD  potassium chloride  SA (KLOR-CON  M) 20 MEQ tablet TAKE 1 TABLET(20 MEQ) BY MOUTH DAILY 02/17/17   [provider]  XARELTO  2.5 MG TABS tablet TAKE 1 TABLET(2.5 MG) BY MOUTH TWICE DAILY 12/10/22   Amin, Saad, MD    Allergies: Patient has no known allergies.    Review of Systems  Musculoskeletal:  Positive for back pain.    Updated Vital Signs BP 128/70   Pulse 75   Temp 98.4 F (36.9 C)   Resp 17   Ht 6' (1.829 m)   Wt 59 kg   SpO2 100%   BMI 17.63 kg/m   Physical Exam  (all labs ordered are listed, but only abnormal results are displayed) Labs Reviewed  CBC WITH DIFFERENTIAL/PLATELET - Abnormal; Notable for the following components:      Result Value   WBC 13.9 (*)    RBC 3.38 (*)    Hemoglobin 10.4 (*)  HCT 30.7 (*)    Neutro Abs 11.1 (*)    All other components within normal limits  COMPREHENSIVE METABOLIC PANEL WITH GFR - Abnormal; Notable for the following components:   Glucose, Bld 194 (*)    BUN 57 (*)    Creatinine, Ser 1.39 (*)    Total Protein 6.1 (*)    GFR, Estimated 51 (*)    All other components within normal limits  OCCULT BLOOD X 1 CARD TO LAB, STOOL - Abnormal; Notable for the following components:   Fecal Occult Bld POSITIVE (*)    All other components within normal limits  CBC WITH DIFFERENTIAL/PLATELET - Abnormal; Notable for the following components:   WBC 12.7 (*)    RBC 3.32 (*)    Hemoglobin 10.1 (*)    HCT 29.6 (*)    Neutro Abs 11.3 (*)    All other components within normal limits    EKG: EKG  Interpretation Date/Time:  Tuesday February 29 2024 12:25:36 EDT Ventricular Rate:  80 PR Interval:  181 QRS Duration:  70 QT Interval:  391 QTC Calculation: 451 R Axis:   79  Text Interpretation: Sinus rhythm Ventricular premature complex Right atrial enlargement Compared with prior EKG from 11/04/2023 Confirmed by Gennaro Bouchard ([id number]) on 02/29/2024 12:46:47 PM  Radiology: CT ANGIO LOWER EXT BILAT W &/OR WO CONTRAST Result Date: 02/29/2024 CLINICAL DATA:  Bilateral lower extremity numbness. Claudication or leg ischemia. EXAM: CT ANGIOGRAPHY OF ABDOMINAL AORTA WITH ILIOFEMORAL RUNOFF TECHNIQUE: Multidetector CT imaging of the abdomen, pelvis and lower extremities was performed using the standard protocol during bolus administration of intravenous contrast. Multiplanar CT image reconstructions and MIPs were obtained to evaluate the vascular anatomy. RADIATION DOSE REDUCTION: This exam was performed according to the departmental dose-optimization program which includes automated exposure control, adjustment of the mA and/or kV according to patient size and/or use of iterative reconstruction technique. CONTRAST:  OMNIPAQUE  IOHEXOL  350 MG/ML SOLN COMPARISON:  CT abdomen pelvis dated 09/09/2023. FINDINGS: Evaluation of this exam is limited due to respiratory motion. VASCULAR Aorta: There is advanced calcified and noncalcified plaque of the entire abdominal aorta. The distal abdominal aorta measures up to 2.8 cm in diameter. No aneurysmal dilatation or dissection. No periaortic fluid collection. There is chronic complete occlusion of the distal abdominal aorta just above the aortic bifurcation. Celiac: The celiac artery and its major branches are patent. SMA: There is atherosclerotic calcification of the origin of the SMA. The SMA remains patent. Renals: There is atherosclerotic calcification of the origins of the renal arteries. The renal arteries are patent. IMA: The IMA is not visualized and appears  occluded. RIGHT Lower Extremity Inflow: Advanced atherosclerotic calcification of the iliac arteries. There is complete occlusion of the right common iliac artery and right external iliac artery. Minimal flow is present in the right internal iliac artery. Outflow: Right axillofemoral artery bypass graft is completely occluded which is a new findings since the prior CT. No flow noted in the right common femoral artery. There is advanced atherosclerotic calcification of the femoral arteries with high-grade luminal narrowing. There is reconstitution of some flow in the right deep and superficial femoral arteries. There is near complete occlusion of the distal SFA approximately 5.5 cm above the knee. There is atherosclerotic calcification of the popliteal artery. No flow detected in the right popliteal artery. Runoff: Evaluation of the calf arteries is very limited due to atherosclerotic calcification. No detectable flow noted in the anterior and posterior tibial arteries. Minimal flow may  be present in the proximal fibular artery. LEFT Lower Extremity Inflow: Advanced atherosclerotic calcification of the iliac arteries. There is complete occlusion of the left common and external iliac arteries. There is reconstitution of some flow in the distal left external iliac artery. There is heavy atherosclerotic calcification and high-grade luminal narrowing of the left internal iliac artery. There is very diminished flow in the left internal iliac artery. Outflow: There is advanced atherosclerotic calcification of the left femoral arteries with high-grade luminal narrowing. There is some flow in the left common femoral artery and left deep femoral artery. The left superficial femoral artery is completely occluded. There is reconstitution of flow in the distal SFA approximally 11 cm above the knee through the deep femoral artery branches. There is diminished flow in the distal SFA and popliteal artery. Runoff: There is  atherosclerotic calcification and luminal narrowing of the proximal arteries of the left calf. There is flow in the anterior and posterior tibial arteries and fibular artery. Veins: No obvious venous abnormality within the limitations of this arterial phase study. Review of the MIP images confirms the above findings. NON-VASCULAR Lower chest: Background of emphysema. No intra-abdominal free air or free fluid. Hepatobiliary: The liver is unremarkable. No biliary dilatation. The gallbladder is unremarkable. Pancreas: Unremarkable. No pancreatic ductal dilatation or surrounding inflammatory changes. Spleen: Normal in size without focal abnormality. Adrenals/Urinary Tract: The adrenal glands are unremarkable. There is no hydronephrosis on either side. The urinary bladder is grossly unremarkable. Stomach/Bowel: There is no bowel obstruction. Lymphatic: No adenopathy. Reproductive: Enlarged prostate gland measuring 5 cm in transverse axial diameter. Other: None Musculoskeletal: No acute or significant osseous findings. IMPRESSION: 1. Complete occlusion of the right axillofemoral artery bypass graft, new since the prior CT. 2. No significant flow in the arteries of the right calf. 3. 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 4. Complete occlusion of the left superficial femoral artery with reconstitution of flow in the distal SFA through the deep femoral artery branches. 5. Aortic Atherosclerosis (ICD10-I70.0) and Emphysema (ICD10-J43.9). Electronically Signed   By: Vanetta Chou M.D.   On: 02/29/2024 13:29     Procedures   Medications Ordered in the ED  iohexol  (OMNIPAQUE ) 350 MG/ML injection 100 mL (125 mLs Intravenous Contrast Given 02/29/24 1121)  pantoprazole  (PROTONIX ) injection 40 mg (40 mg Intravenous Given 02/29/24 1355)    Clinical Course as of 02/29/24 1726  Tue Feb 29, 2024  1012 Patient with history of PAD evaluated with complaints of lower extremity  coldness, numbness and pain.  Reports he has been having symptoms intermittently over the past few months however they have significantly gotten worse in the past few days.  Has no associated back pain or urinary/fecal incontinence.  He is hemodynamically stable.  On exam he has no calf tenderness or swelling, DP/PT pulses are undetectable on Doppler.  Reduced cap refill.  He has no color change.  Appears that his exam in March was similar.  Will obtain CT angio of lower extremities.  Also complained of dark stools over the past few days.  He is on [facility].  Melena noted on exam.  Has no history of GI bleeds. [JT]  1125 CBC with Differential(!) Mild leukocytosis of 13.9, hemoglobin of 10.4 [JT]  1126 Occult blood card to lab, stool(!) positive [JT]  1126 Comprehensive metabolic panel(!) Mildly elevated creatinine with elevated BUN [JT]  1429 [facility] vascular consulted.  Discussed with Dr. Pearline.  Recommended reaching out to [facility] where  he has received the predominance of his care. [JT]  1546 CT ANGIO LOWER EXT BILAT W &/OR WO CONTRAST [JT]  1550 Discussed with [facility] hospitalist Dr. Danelle, Dr. Roseanne. Agreed for admission.  They recommended holding off on heparin  at this time. [JT]    Clinical Course User Index [JT] Donnajean Lynwood DEL, PA-C                                 Medical Decision Making Amount and/or Complexity of Data Reviewed Labs: ordered. Decision-making details documented in ED Course. Radiology: ordered.  Risk Prescription drug management.   This patient presents to the ED with chief complaint(s) of Back pain.  The complaint involves an extensive differential diagnosis and also carries with it a high risk of complications and morbidity.   Pertinent past medical history as listed in HPI  The differential diagnosis includes  Considered cauda equina or spinal abscess, however patient has no back pain or risk factors for this.  Considered acute ischemic leg, however this  appears to be more of an acute on chronic issue.  Sensation does appear to be intact at this time.  Negative Homans.  Do not suspect DVT.  Additional history obtained: Records reviewed Care Everywhere/External Records  Disposition:   Patient will be transferred via [facility] to [facility].  Social Determinants of Health:   none  This note was dictated with voice recognition software.  Despite best efforts at proofreading, errors may have occurred which can change the documentation meaning.       Final diagnoses:  Melena  PAD (peripheral artery disease) [facility])    ED Discharge Orders     None          Donnajean Lynwood DEL, PA-C 02/29/24 1726    Kammerer, Megan L, DO 03/01/24 [phone]

## 2024-02-29 NOTE — ED Notes (Signed)
 Assisted patient to bedside commode. Positive for melena in his pants, liquid diarrhea with obvious red blood as well. Pt does not unstable on his feet, transferred to bedside commode without incident.

## 2024-02-29 NOTE — ED Notes (Signed)
 EDP at bedside checking distal pedal pulses.

## 2024-02-29 NOTE — ED Notes (Signed)
 Pt is refusing rectal exam by EDP, saying he knows his feet are numb from poor circulation.

## 2024-03-29 DEATH — deceased
